# Patient Record
Sex: Male | Born: 1955 | Race: White | Hispanic: No | Smoking: Former smoker
Health system: Southern US, Community
[De-identification: ages and names within clinical notes are randomized; demographics above are authoritative.]

## PROBLEM LIST (undated history)

## (undated) DIAGNOSIS — I10 Essential (primary) hypertension: Secondary | ICD-10-CM

## (undated) DIAGNOSIS — F329 Major depressive disorder, single episode, unspecified: Secondary | ICD-10-CM

## (undated) DIAGNOSIS — K219 Gastro-esophageal reflux disease without esophagitis: Secondary | ICD-10-CM

## (undated) DIAGNOSIS — F32A Depression, unspecified: Secondary | ICD-10-CM

## (undated) DIAGNOSIS — F1010 Alcohol abuse, uncomplicated: Secondary | ICD-10-CM

---

## 1991-10-26 HISTORY — PX: HERNIA REPAIR: SHX51

## 2012-05-25 ENCOUNTER — Encounter: Payer: Self-pay | Admitting: Internal Medicine

## 2014-07-11 ENCOUNTER — Encounter (HOSPITAL_COMMUNITY): Payer: Self-pay | Admitting: Emergency Medicine

## 2014-07-11 ENCOUNTER — Inpatient Hospital Stay (HOSPITAL_COMMUNITY)
Admission: EM | Admit: 2014-07-11 | Discharge: 2014-07-18 | DRG: 640 | Disposition: A | Discharge status: Home / Self Care | Payer: 59 | Attending: Internal Medicine | Admitting: Internal Medicine

## 2014-07-11 DIAGNOSIS — R5381 Other malaise: Secondary | ICD-10-CM | POA: Diagnosis present

## 2014-07-11 DIAGNOSIS — I10 Essential (primary) hypertension: Secondary | ICD-10-CM | POA: Diagnosis present

## 2014-07-11 DIAGNOSIS — F1910 Other psychoactive substance abuse, uncomplicated: Secondary | ICD-10-CM | POA: Diagnosis present

## 2014-07-11 DIAGNOSIS — F329 Major depressive disorder, single episode, unspecified: Secondary | ICD-10-CM | POA: Diagnosis present

## 2014-07-11 DIAGNOSIS — G9340 Encephalopathy, unspecified: Secondary | ICD-10-CM | POA: Diagnosis present

## 2014-07-11 DIAGNOSIS — F411 Generalized anxiety disorder: Secondary | ICD-10-CM | POA: Diagnosis present

## 2014-07-11 DIAGNOSIS — E871 Hypo-osmolality and hyponatremia: Secondary | ICD-10-CM | POA: Diagnosis not present

## 2014-07-11 DIAGNOSIS — D649 Anemia, unspecified: Secondary | ICD-10-CM | POA: Diagnosis present

## 2014-07-11 DIAGNOSIS — Z6834 Body mass index (BMI) 34.0-34.9, adult: Secondary | ICD-10-CM

## 2014-07-11 DIAGNOSIS — F3289 Other specified depressive episodes: Secondary | ICD-10-CM | POA: Diagnosis present

## 2014-07-11 DIAGNOSIS — Z23 Encounter for immunization: Secondary | ICD-10-CM

## 2014-07-11 DIAGNOSIS — F32A Depression, unspecified: Secondary | ICD-10-CM | POA: Diagnosis present

## 2014-07-11 DIAGNOSIS — F1010 Alcohol abuse, uncomplicated: Secondary | ICD-10-CM | POA: Diagnosis present

## 2014-07-11 DIAGNOSIS — J9600 Acute respiratory failure, unspecified whether with hypoxia or hypercapnia: Secondary | ICD-10-CM | POA: Diagnosis present

## 2014-07-11 DIAGNOSIS — R4182 Altered mental status, unspecified: Secondary | ICD-10-CM

## 2014-07-11 DIAGNOSIS — Z720 Tobacco use: Secondary | ICD-10-CM | POA: Diagnosis present

## 2014-07-11 DIAGNOSIS — J189 Pneumonia, unspecified organism: Secondary | ICD-10-CM | POA: Diagnosis present

## 2014-07-11 DIAGNOSIS — E876 Hypokalemia: Secondary | ICD-10-CM | POA: Diagnosis present

## 2014-07-11 DIAGNOSIS — G4733 Obstructive sleep apnea (adult) (pediatric): Secondary | ICD-10-CM | POA: Diagnosis present

## 2014-07-11 DIAGNOSIS — R569 Unspecified convulsions: Secondary | ICD-10-CM | POA: Diagnosis present

## 2014-07-11 DIAGNOSIS — J9601 Acute respiratory failure with hypoxia: Secondary | ICD-10-CM | POA: Diagnosis present

## 2014-07-11 DIAGNOSIS — K219 Gastro-esophageal reflux disease without esophagitis: Secondary | ICD-10-CM | POA: Diagnosis present

## 2014-07-11 DIAGNOSIS — I4891 Unspecified atrial fibrillation: Secondary | ICD-10-CM | POA: Diagnosis present

## 2014-07-11 DIAGNOSIS — E662 Morbid (severe) obesity with alveolar hypoventilation: Secondary | ICD-10-CM | POA: Diagnosis present

## 2014-07-11 DIAGNOSIS — Z87891 Personal history of nicotine dependence: Secondary | ICD-10-CM

## 2014-07-11 HISTORY — DX: Essential (primary) hypertension: I10

## 2014-07-11 HISTORY — DX: Alcohol abuse, uncomplicated: F10.10

## 2014-07-11 HISTORY — DX: Major depressive disorder, single episode, unspecified: F32.9

## 2014-07-11 HISTORY — DX: Depression, unspecified: F32.A

## 2014-07-11 HISTORY — DX: Gastro-esophageal reflux disease without esophagitis: K21.9

## 2014-07-11 LAB — I-STAT TROPONIN, ED: Troponin i, poc: 0.01 ng/mL (ref 0.00–0.08)

## 2014-07-11 LAB — I-STAT CG4 LACTIC ACID, ED: Lactic Acid, Venous: 2.3 mmol/L — ABNORMAL HIGH (ref 0.5–2.2)

## 2014-07-11 MED ORDER — SODIUM CHLORIDE 0.9 % IV BOLUS (SEPSIS)
1000.0000 mL | Freq: Once | INTRAVENOUS | Status: AC
  Administered 2014-07-11: 1000 mL via INTRAVENOUS

## 2014-07-11 NOTE — ED Notes (Addendum)
Pt presents from ED via [facility] with c/o seizure activity and AMS. Pt's girlfriend found pt on floor around 2115 and noted seizure like activity and possibly hit head on a pool table while falling. EMS arrived and patient became physically combative but remained nonverbal - total of  Midazolam given by EMS PTA. EMS noted Left lower lip and tongue injury, per EMS pt's girlfriend reported pt has been drinking an increased amount of alcohol and possibly utilizing prescription drugs in the last few days. Half empty bottle of Nyquil was found next to patient, unsure if this was ingested or not.  Incontinence noted upon arrival, pt with snoring respirations and on non-rebreather, unresponsive to painful stimuli.

## 2014-07-11 NOTE — ED Provider Notes (Signed)
CSN: [id number]     Arrival date & time 07/11/14  2241 History   First MD Initiated Contact with Patient 07/11/14 2256     Chief Complaint  Patient presents with  . Altered Mental Status  . Seizures     (Consider location/radiation/quality/duration/timing/severity/associated sxs/prior Treatment) HPI Jeffrey Humphrey is a 58 y.o. male with unknown past medical history coming in with altered mental status after being found down. History cannot be obtained by the patient due to his altered mental status however EMS states that his girlfriend found him on the ground at home underneath a pool table. The girlfriend thought she saw seizure-like activity, she began chest compressions for approximately 30 seconds. Upon EMS arrival, there were no chest compressions given and the patient was breathing with a pulse. In route the patient became increasingly agitated and EMS gave him 2 mg of midazolam.  There were multiple prescription drugs laying next to him as well as a bottle of Nyquil.  Patient is currently responding to pain, but him in front of the history.   History reviewed. No pertinent past medical history. History reviewed. No pertinent past surgical history. No family history on file. History  Substance Use Topics  . Smoking status: Not on file  . Smokeless tobacco: Not on file  . Alcohol Use: Not on file    Review of Systems  Unable to perform ROS: Mental status change      Allergies  Review of patient's allergies indicates not on file.  Home Medications   Prior to Admission medications   Not on File   BP 114/56  Pulse 81  Temp(Src) 97.6 F (36.4 C) (Axillary)  Resp 14  SpO2 100% Physical Exam  ED Course  Procedures (including critical care time) Labs Review Labs Reviewed  CBC WITH DIFFERENTIAL - Abnormal; Notable for the following:    WBC 15.6 (*)    RBC 3.88 (*)    HCT 34.6 (*)    MCHC 38.2 (*)    Neutrophils Relative % 91 (*)    Neutro Abs 14.2 (*)     Lymphocytes Relative 5 (*)    All other components within normal limits  COMPREHENSIVE METABOLIC PANEL - Abnormal; Notable for the following:    Sodium 114 (*)    Potassium 3.2 (*)    Chloride 74 (*)    Glucose, Bld 121 (*)    Albumin 3.4 (*)    AST 114 (*)    ALT 130 (*)    All other components within normal limits  LIPASE, BLOOD - Abnormal; Notable for the following:    Lipase 276 (*)    All other components within normal limits  URINALYSIS, ROUTINE W REFLEX MICROSCOPIC - Abnormal; Notable for the following:    Hgb urine dipstick SMALL (*)    Ketones, ur 15 (*)    All other components within normal limits  CK - Abnormal; Notable for the following:    Total CK 415 (*)    All other components within normal limits  SALICYLATE LEVEL - Abnormal; Notable for the following:    Salicylate Lvl <2.0 (*)    All other components within normal limits  URINE RAPID DRUG SCREEN (HOSP PERFORMED) - Abnormal; Notable for the following:    Benzodiazepines POSITIVE (*)    All other components within normal limits  CBC - Abnormal; Notable for the following:    WBC 13.4 (*)    RBC 3.83 (*)    HCT 35.0 (*)  MCHC 37.1 (*)    All other components within normal limits  BASIC METABOLIC PANEL - Abnormal; Notable for the following:    Sodium 117 (*)    Potassium 3.4 (*)    Chloride 77 (*)    Glucose, Bld 117 (*)    Calcium 8.1 (*)    All other components within normal limits  URINALYSIS, ROUTINE W REFLEX MICROSCOPIC - Abnormal; Notable for the following:    Hgb urine dipstick MODERATE (*)    Ketones, ur 15 (*)    All other components within normal limits  GLUCOSE, CAPILLARY - Abnormal; Notable for the following:    Glucose-Capillary 134 (*)    All other components within normal limits  I-STAT CG4 LACTIC ACID, ED - Abnormal; Notable for the following:    Lactic Acid, Venous 2.30 (*)    All other components within normal limits  I-STAT VENOUS BLOOD GAS, ED - Abnormal; Notable for the following:     pH, Ven 7.416 (*)    pO2, Ven 50.0 (*)    Bicarbonate 29.1 (*)    Acid-Base Excess 4.0 (*)    All other components within normal limits  I-STAT CHEM 8, ED - Abnormal; Notable for the following:    Sodium 113 (*)    Chloride 77 (*)    Glucose, Bld 109 (*)    Calcium, Ion 1.02 (*)    All other components within normal limits  POCT I-STAT 3, ART BLOOD GAS (G3+) - Abnormal; Notable for the following:    pH, Arterial 7.243 (*)    pCO2 arterial 68.1 (*)    Bicarbonate 29.4 (*)    All other components within normal limits  POCT I-STAT 3, ART BLOOD GAS (G3+) - Abnormal; Notable for the following:    pO2, Arterial 178.0 (*)    Bicarbonate 25.0 (*)    All other components within normal limits  MRSA PCR SCREENING  URINE CULTURE  CULTURE, BLOOD (ROUTINE X 2)  CULTURE, BLOOD (ROUTINE X 2)  CULTURE, RESPIRATORY (NON-EXPECTORATED)  ACETAMINOPHEN LEVEL  ETHANOL  PROCALCITONIN  URINE MICROSCOPIC-ADD ON  MAGNESIUM  PHOSPHORUS  URINE MICROSCOPIC-ADD ON  BLOOD GAS, VENOUS  PRO B NATRIURETIC PEPTIDE  COMPREHENSIVE METABOLIC PANEL  BASIC METABOLIC PANEL  BASIC METABOLIC PANEL  BASIC METABOLIC PANEL  BASIC METABOLIC PANEL  BASIC METABOLIC PANEL  BASIC METABOLIC PANEL  BASIC METABOLIC PANEL  BASIC METABOLIC PANEL  LACTIC ACID, PLASMA  LACTIC ACID, PLASMA  LACTIC ACID, PLASMA  CBC  PROTIME-INR  BLOOD GAS, ARTERIAL  TRIGLYCERIDES  CBG MONITORING, ED  I-STAT TROPOININ, ED    Imaging Review Ct Head Wo Contrast  07/12/2014   CLINICAL DATA:  Altered mental status, seizure  EXAM: CT HEAD WITHOUT CONTRAST  CT CERVICAL SPINE WITHOUT CONTRAST  TECHNIQUE: Multidetector CT imaging of the head and cervical spine was performed following the standard protocol without intravenous contrast. Multiplanar CT image reconstructions of the cervical spine were also generated.  COMPARISON:  None.  FINDINGS: CT HEAD FINDINGS  There is no acute intracranial hemorrhage or infarct. No mass lesion or midline  shift. Gray-white matter differentiation is well maintained. Ventricles are normal in size without evidence of hydrocephalus. CSF containing spaces are within normal limits. No extra-axial fluid collection.  The calvarium is intact.  Orbital soft tissues are within normal limits.  The paranasal sinuses and mastoid air cells are well pneumatized and free of fluid.  Scalp soft tissues are unremarkable.  CT CERVICAL SPINE FINDINGS  Study is degraded by  motion artifact.  There is reversal of the normal cervical lordosis with apex at C4-5. No listhesis. Vertebral body heights are preserved. Normal C1-2 articulations are intact. There is fullness of the prevertebral soft tissues with the internal carotid arteries medialized into the retropharyngeal space. No retropharyngeal hematoma or other abnormality. No acute fracture or listhesis.  Advanced multilevel degenerative disc disease is evidenced by intervertebral disc space narrowing, endplate sclerosis, and osteophytosis is seen throughout the cervical spine, most evident at C4-5, C5-6, and C6-7.  Visualized soft tissues of the neck are within normal limits. Visualized lung apices are clear without evidence of apical pneumothorax.  IMPRESSION: CT BRAIN:  No acute intracranial process.  CT CERVICAL SPINE:  1. Motion degraded study with no definite acute traumatic injury within the cervical spine. 2. Reversal of the normal cervical lordosis with moderate multilevel degenerative disc disease, most severe at C4-5 through C6-7.   Electronically Signed   By: Rise Mu M.D.   On: 07/12/2014 01:28   Ct Cervical Spine Wo Contrast  07/12/2014   CLINICAL DATA:  Altered mental status, seizure  EXAM: CT HEAD WITHOUT CONTRAST  CT CERVICAL SPINE WITHOUT CONTRAST  TECHNIQUE: Multidetector CT imaging of the head and cervical spine was performed following the standard protocol without intravenous contrast. Multiplanar CT image reconstructions of the cervical spine were also  generated.  COMPARISON:  None.  FINDINGS: CT HEAD FINDINGS  There is no acute intracranial hemorrhage or infarct. No mass lesion or midline shift. Gray-white matter differentiation is well maintained. Ventricles are normal in size without evidence of hydrocephalus. CSF containing spaces are within normal limits. No extra-axial fluid collection.  The calvarium is intact.  Orbital soft tissues are within normal limits.  The paranasal sinuses and mastoid air cells are well pneumatized and free of fluid.  Scalp soft tissues are unremarkable.  CT CERVICAL SPINE FINDINGS  Study is degraded by motion artifact.  There is reversal of the normal cervical lordosis with apex at C4-5. No listhesis. Vertebral body heights are preserved. Normal C1-2 articulations are intact. There is fullness of the prevertebral soft tissues with the internal carotid arteries medialized into the retropharyngeal space. No retropharyngeal hematoma or other abnormality. No acute fracture or listhesis.  Advanced multilevel degenerative disc disease is evidenced by intervertebral disc space narrowing, endplate sclerosis, and osteophytosis is seen throughout the cervical spine, most evident at C4-5, C5-6, and C6-7.  Visualized soft tissues of the neck are within normal limits. Visualized lung apices are clear without evidence of apical pneumothorax.  IMPRESSION: CT BRAIN:  No acute intracranial process.  CT CERVICAL SPINE:  1. Motion degraded study with no definite acute traumatic injury within the cervical spine. 2. Reversal of the normal cervical lordosis with moderate multilevel degenerative disc disease, most severe at C4-5 through C6-7.   Electronically Signed   By: Rise Mu M.D.   On: 07/12/2014 01:28   Dg Chest Port 1 View  07/12/2014   CLINICAL DATA:  Endotracheal tube placement.  EXAM: PORTABLE CHEST - 1 VIEW  COMPARISON:  Chest radiograph performed earlier today at 1:30 a.m.  FINDINGS: The patient's endotracheal tube is seen  ending 2-3 cm above the carina. An enteric tube noted extending below the diaphragm. A right IJ line is noted ending about the mid SVC.  The lungs are hypoexpanded. Left basilar airspace opacity may reflect atelectasis or pneumonia. Vascular congestion and vascular crowding are seen. A small left pleural effusion is suspected. No pneumothorax is seen.  The cardiomediastinal  silhouette is borderline enlarged. No acute osseous abnormalities are identified.  IMPRESSION: 1. Endotracheal tube seen ending 2-3 cm above the carina. 2. Lungs hypoexpanded. Left basilar airspace opacity may reflect atelectasis or pneumonia. 3. Vascular congestion and borderline cardiomegaly. Small left pleural effusion suspected.   Electronically Signed   By: Roanna Raider M.D.   On: 07/12/2014 05:43   Dg Chest Portable 1 View  07/12/2014   CLINICAL DATA:  Central line placement.  EXAM: PORTABLE CHEST - 1 VIEW  COMPARISON:  None.  FINDINGS: Right IJ central line is in place, tip overlying the level of the superior vena cava. The patient is rotated towards the left. The heart is enlarged. No evidence for edema. No pneumothorax.  IMPRESSION: Right IJ central line tip overlying the superior vena cava.   Electronically Signed   By: Rosalie Gums M.D.   On: 07/12/2014 01:50     EKG Interpretation   Date/Time:  Thursday July 11 2014 22:50:15 EDT Ventricular Rate:  83 PR Interval:  194 QRS Duration: 106 QT Interval:  429 QTC Calculation: 504 R Axis:   35 Text Interpretation:  Sinus rhythm RSR' in V1 or V2, probably normal  variant Prolonged QT interval Confirmed by Erroll Luna [phone])  on 07/12/2014 6:54:32 AM      MDM   Final diagnoses:  None    Patient presents emergency department out of concern for altered mental status and seizure. He was immediately sedated in the emergency department with Ativan so we could obtain CT imaging. He required a total of 6 mg of Ativan and 5 mg of Haldol throughout his ED  stay. CT scan as above is negative. Laboratory studies revealed a sodium of 114. This is likely the cause of the patient's seizure at home, and they have been drug-induced, considering ecstasy. Patient was bolused 100 cc of 3% normal saline via central line. She denied any seizure activity in the emergency department, however he was persistently altered and not returned back to his baseline.  He was given 2 L of IV fluids.  CENTRAL LINE Performed by: Tomasita Crumble Consent: The procedure was performed in an emergent situation. Required items: required blood products, implants, devices, and special equipment available Patient identity confirmed: arm band and provided demographic data Time out: Immediately prior to procedure a "time out" was called to verify the correct patient, procedure, equipment, support staff and site/side marked as required. Indications: vascular access Anesthesia: local infiltration Local anesthetic: lidocaine 1% with epinephrine Anesthetic total: 3 ml Patient sedated: no Preparation: skin prepped with 2% chlorhexidine Skin prep agent dried: skin prep agent completely dried prior to procedure Sterile barriers: all five maximum sterile barriers used - cap, mask, sterile gown, sterile gloves, and large sterile sheet Hand hygiene: hand hygiene performed prior to central venous catheter insertion  Location details: Right IJ   Catheter type: triple lumen Catheter size: 8 Fr Pre-procedure: landmarks identified Ultrasound guidance: Dynamic approach  Successful placement: yes Post-procedure: line sutured and dressing applied Assessment: blood return through all parts, free fluid flow, placement verified by x-ray and no pneumothorax on x-ray Patient tolerance: Patient tolerated the procedure well with no immediate complications.  CRITICAL CARE Performed by: Tomasita Crumble   Total critical care time: 45 minutes  Critical care time was exclusive of separately billable  procedures and treating other patients.  Critical care was necessary to treat or prevent imminent or life-threatening deterioration.  Critical care was time spent personally by me on the following activities: development of  treatment plan with patient and/or surrogate as well as nursing, discussions with consultants, evaluation of patient's response to treatment, examination of patient, obtaining history from patient or surrogate, ordering and performing treatments and interventions, ordering and review of laboratory studies, ordering and review of radiographic studies, pulse oximetry and re-evaluation of patient's condition.     Tomasita Crumble, MD 07/12/14 [phone]

## 2014-07-11 NOTE — ED Notes (Signed)
Dr. Oni at bedside. 

## 2014-07-12 ENCOUNTER — Emergency Department (HOSPITAL_COMMUNITY): Payer: 59

## 2014-07-12 ENCOUNTER — Encounter (HOSPITAL_COMMUNITY): Payer: Self-pay | Admitting: Anesthesiology

## 2014-07-12 ENCOUNTER — Inpatient Hospital Stay (HOSPITAL_COMMUNITY): Payer: 59

## 2014-07-12 ENCOUNTER — Encounter (HOSPITAL_COMMUNITY): Payer: 59 | Admitting: Anesthesiology

## 2014-07-12 ENCOUNTER — Encounter (HOSPITAL_COMMUNITY): Payer: Self-pay

## 2014-07-12 DIAGNOSIS — F411 Generalized anxiety disorder: Secondary | ICD-10-CM | POA: Diagnosis present

## 2014-07-12 DIAGNOSIS — E662 Morbid (severe) obesity with alveolar hypoventilation: Secondary | ICD-10-CM | POA: Diagnosis present

## 2014-07-12 DIAGNOSIS — D649 Anemia, unspecified: Secondary | ICD-10-CM | POA: Diagnosis present

## 2014-07-12 DIAGNOSIS — I369 Nonrheumatic tricuspid valve disorder, unspecified: Secondary | ICD-10-CM

## 2014-07-12 DIAGNOSIS — G9340 Encephalopathy, unspecified: Secondary | ICD-10-CM | POA: Diagnosis present

## 2014-07-12 DIAGNOSIS — E871 Hypo-osmolality and hyponatremia: Secondary | ICD-10-CM | POA: Diagnosis present

## 2014-07-12 DIAGNOSIS — Z87891 Personal history of nicotine dependence: Secondary | ICD-10-CM | POA: Diagnosis not present

## 2014-07-12 DIAGNOSIS — F1010 Alcohol abuse, uncomplicated: Secondary | ICD-10-CM | POA: Diagnosis present

## 2014-07-12 DIAGNOSIS — I4891 Unspecified atrial fibrillation: Secondary | ICD-10-CM | POA: Diagnosis not present

## 2014-07-12 DIAGNOSIS — Z6834 Body mass index (BMI) 34.0-34.9, adult: Secondary | ICD-10-CM | POA: Diagnosis not present

## 2014-07-12 DIAGNOSIS — R569 Unspecified convulsions: Secondary | ICD-10-CM | POA: Diagnosis present

## 2014-07-12 DIAGNOSIS — R4182 Altered mental status, unspecified: Secondary | ICD-10-CM | POA: Diagnosis present

## 2014-07-12 DIAGNOSIS — F3289 Other specified depressive episodes: Secondary | ICD-10-CM | POA: Diagnosis present

## 2014-07-12 DIAGNOSIS — R5381 Other malaise: Secondary | ICD-10-CM | POA: Diagnosis present

## 2014-07-12 DIAGNOSIS — G4733 Obstructive sleep apnea (adult) (pediatric): Secondary | ICD-10-CM | POA: Diagnosis present

## 2014-07-12 DIAGNOSIS — J189 Pneumonia, unspecified organism: Secondary | ICD-10-CM | POA: Diagnosis present

## 2014-07-12 DIAGNOSIS — F329 Major depressive disorder, single episode, unspecified: Secondary | ICD-10-CM | POA: Diagnosis present

## 2014-07-12 DIAGNOSIS — K219 Gastro-esophageal reflux disease without esophagitis: Secondary | ICD-10-CM | POA: Diagnosis present

## 2014-07-12 DIAGNOSIS — I10 Essential (primary) hypertension: Secondary | ICD-10-CM | POA: Diagnosis present

## 2014-07-12 DIAGNOSIS — J9600 Acute respiratory failure, unspecified whether with hypoxia or hypercapnia: Secondary | ICD-10-CM | POA: Diagnosis present

## 2014-07-12 DIAGNOSIS — Z23 Encounter for immunization: Secondary | ICD-10-CM | POA: Diagnosis not present

## 2014-07-12 DIAGNOSIS — F1910 Other psychoactive substance abuse, uncomplicated: Secondary | ICD-10-CM | POA: Diagnosis present

## 2014-07-12 DIAGNOSIS — E876 Hypokalemia: Secondary | ICD-10-CM | POA: Diagnosis present

## 2014-07-12 LAB — BASIC METABOLIC PANEL
ANION GAP: 13 (ref 5–15)
Anion gap: 12 (ref 5–15)
Anion gap: 12 (ref 5–15)
Anion gap: 11 (ref 5–15)
Anion gap: 11 (ref 5–15)
Anion gap: 11 (ref 5–15)
BUN: 11 mg/dL (ref 6–23)
BUN: 8 mg/dL (ref 6–23)
BUN: 8 mg/dL (ref 6–23)
BUN: 10 mg/dL (ref 6–23)
BUN: 10 mg/dL (ref 6–23)
BUN: 11 mg/dL (ref 6–23)
CALCIUM: 8.4 mg/dL (ref 8.4–10.5)
CALCIUM: 8.5 mg/dL (ref 8.4–10.5)
CHLORIDE: 77 meq/L — AB (ref 96–112)
CHLORIDE: 89 meq/L — AB (ref 96–112)
CHLORIDE: 90 meq/L — AB (ref 96–112)
CO2: 27 meq/L (ref 19–32)
CO2: 24 mEq/L (ref 19–32)
CO2: 24 mEq/L (ref 19–32)
CO2: 24 meq/L (ref 19–32)
CO2: 25 mEq/L (ref 19–32)
CO2: 24 meq/L (ref 19–32)
Calcium: 8.1 mg/dL — ABNORMAL LOW (ref 8.4–10.5)
Calcium: 8.2 mg/dL — ABNORMAL LOW (ref 8.4–10.5)
Calcium: 8.3 mg/dL — ABNORMAL LOW (ref 8.4–10.5)
Calcium: 8.3 mg/dL — ABNORMAL LOW (ref 8.4–10.5)
Chloride: 87 mEq/L — ABNORMAL LOW (ref 96–112)
Chloride: 87 mEq/L — ABNORMAL LOW (ref 96–112)
Chloride: 87 mEq/L — ABNORMAL LOW (ref 96–112)
Creatinine, Ser: 0.66 mg/dL (ref 0.50–1.35)
Creatinine, Ser: 0.71 mg/dL (ref 0.50–1.35)
Creatinine, Ser: 0.73 mg/dL (ref 0.50–1.35)
Creatinine, Ser: 0.74 mg/dL (ref 0.50–1.35)
Creatinine, Ser: 0.75 mg/dL (ref 0.50–1.35)
Creatinine, Ser: 0.73 mg/dL (ref 0.50–1.35)
GFR calc Af Amer: >90 mL/min (ref >90)
GFR calc Af Amer: >90 mL/min (ref >90)
GFR calc non Af Amer: >90 mL/min (ref >90)
GFR calc non Af Amer: >90 mL/min (ref >90)
GFR calc non Af Amer: >90 mL/min (ref >90)
GLUCOSE: 135 mg/dL — AB (ref 70–99)
Glucose, Bld: 117 mg/dL — ABNORMAL HIGH (ref 70–99)
Glucose, Bld: 112 mg/dL — ABNORMAL HIGH (ref 70–99)
Glucose, Bld: 114 mg/dL — ABNORMAL HIGH (ref 70–99)
Glucose, Bld: 123 mg/dL — ABNORMAL HIGH (ref 70–99)
Glucose, Bld: 130 mg/dL — ABNORMAL HIGH (ref 70–99)
POTASSIUM: 3.4 meq/L — AB (ref 3.7–5.3)
POTASSIUM: 3.2 meq/L — AB (ref 3.7–5.3)
POTASSIUM: 3 meq/L — AB (ref 3.7–5.3)
Potassium: 3.3 mEq/L — ABNORMAL LOW (ref 3.7–5.3)
Potassium: 3.3 mEq/L — ABNORMAL LOW (ref 3.7–5.3)
Potassium: 3.1 mEq/L — ABNORMAL LOW (ref 3.7–5.3)
SODIUM: 123 meq/L — AB (ref 137–147)
SODIUM: 124 meq/L — AB (ref 137–147)
SODIUM: 125 meq/L — AB (ref 137–147)
Sodium: 117 mEq/L — CL (ref 137–147)
Sodium: 123 mEq/L — ABNORMAL LOW (ref 137–147)
Sodium: 123 mEq/L — ABNORMAL LOW (ref 137–147)

## 2014-07-12 LAB — GLUCOSE, CAPILLARY
GLUCOSE-CAPILLARY: 134 mg/dL — AB (ref 70–99)
GLUCOSE-CAPILLARY: 116 mg/dL — AB (ref 70–99)
GLUCOSE-CAPILLARY: 110 mg/dL — AB (ref 70–99)
GLUCOSE-CAPILLARY: 124 mg/dL — AB (ref 70–99)
Glucose-Capillary: 109 mg/dL — ABNORMAL HIGH (ref 70–99)

## 2014-07-12 LAB — I-STAT VENOUS BLOOD GAS, ED
Acid-Base Excess: 4 mmol/L — ABNORMAL HIGH (ref 0.0–2.0)
Bicarbonate: 29.1 mEq/L — ABNORMAL HIGH (ref 20.0–24.0)
O2 Saturation: 85 %
PCO2 VEN: 45.2 mmHg (ref 45.0–50.0)
PO2 VEN: 50 mmHg — AB (ref 30.0–45.0)
TCO2: 30 mmol/L (ref 0–100)
pH, Ven: 7.416 — ABNORMAL HIGH (ref 7.250–7.300)

## 2014-07-12 LAB — CBC WITH DIFFERENTIAL/PLATELET
Basophils Absolute: 0 10*3/uL (ref 0.0–0.1)
Basophils Relative: 0 % (ref 0–1)
EOS PCT: 0 % (ref 0–5)
Eosinophils Absolute: 0.1 10*3/uL (ref 0.0–0.7)
HEMATOCRIT: 34.6 % — AB (ref 39.0–52.0)
HEMOGLOBIN: 13.2 g/dL (ref 13.0–17.0)
LYMPHS ABS: 0.7 10*3/uL (ref 0.7–4.0)
Lymphocytes Relative: 5 % — ABNORMAL LOW (ref 12–46)
MCH: 34 pg (ref 26.0–34.0)
MCHC: 38.2 g/dL — ABNORMAL HIGH (ref 30.0–36.0)
MCV: 89.2 fL (ref 78.0–100.0)
MONO ABS: 0.6 10*3/uL (ref 0.1–1.0)
Monocytes Relative: 4 % (ref 3–12)
Neutro Abs: 14.2 10*3/uL — ABNORMAL HIGH (ref 1.7–7.7)
Neutrophils Relative %: 91 % — ABNORMAL HIGH (ref 43–77)
Platelets: 276 10*3/uL (ref 150–400)
RBC: 3.88 MIL/uL — AB (ref 4.22–5.81)
RDW: 12.4 % (ref 11.5–15.5)
WBC: 15.6 10*3/uL — ABNORMAL HIGH (ref 4.0–10.5)

## 2014-07-12 LAB — COMPREHENSIVE METABOLIC PANEL
ALT: 130 U/L — AB (ref 0–53)
ALT: 121 U/L — AB (ref 0–53)
ANION GAP: 13 (ref 5–15)
AST: 114 U/L — ABNORMAL HIGH (ref 0–37)
AST: 111 U/L — ABNORMAL HIGH (ref 0–37)
Albumin: 3.4 g/dL — ABNORMAL LOW (ref 3.5–5.2)
Albumin: 3.3 g/dL — ABNORMAL LOW (ref 3.5–5.2)
Alkaline Phosphatase: 90 U/L (ref 39–117)
Alkaline Phosphatase: 88 U/L (ref 39–117)
Anion gap: 14 (ref 5–15)
BILIRUBIN TOTAL: 0.7 mg/dL (ref 0.3–1.2)
BUN: 13 mg/dL (ref 6–23)
BUN: 10 mg/dL (ref 6–23)
CALCIUM: 8.4 mg/dL (ref 8.4–10.5)
CALCIUM: 8.5 mg/dL (ref 8.4–10.5)
CO2: 26 meq/L (ref 19–32)
CO2: 25 mEq/L (ref 19–32)
CREATININE: 0.74 mg/dL (ref 0.50–1.35)
Chloride: 74 mEq/L — ABNORMAL LOW (ref 96–112)
Chloride: 79 mEq/L — ABNORMAL LOW (ref 96–112)
Creatinine, Ser: 0.69 mg/dL (ref 0.50–1.35)
GFR calc Af Amer: >90 mL/min (ref >90)
GFR calc non Af Amer: >90 mL/min (ref >90)
Glucose, Bld: 121 mg/dL — ABNORMAL HIGH (ref 70–99)
Glucose, Bld: 124 mg/dL — ABNORMAL HIGH (ref 70–99)
POTASSIUM: 3.5 meq/L — AB (ref 3.7–5.3)
Potassium: 3.2 mEq/L — ABNORMAL LOW (ref 3.7–5.3)
SODIUM: 117 meq/L — AB (ref 137–147)
Sodium: 114 mEq/L — CL (ref 137–147)
TOTAL PROTEIN: 6.3 g/dL (ref 6.0–8.3)
Total Bilirubin: 0.7 mg/dL (ref 0.3–1.2)
Total Protein: 6.4 g/dL (ref 6.0–8.3)

## 2014-07-12 LAB — CBC
HCT: 35 % — ABNORMAL LOW (ref 39.0–52.0)
HCT: 34.3 % — ABNORMAL LOW (ref 39.0–52.0)
HEMOGLOBIN: 13 g/dL (ref 13.0–17.0)
Hemoglobin: 12.8 g/dL — ABNORMAL LOW (ref 13.0–17.0)
MCH: 33.9 pg (ref 26.0–34.0)
MCH: 34.5 pg — AB (ref 26.0–34.0)
MCHC: 37.1 g/dL — ABNORMAL HIGH (ref 30.0–36.0)
MCV: 91.4 fL (ref 78.0–100.0)
MCV: 92.5 fL (ref 78.0–100.0)
Platelets: 248 10*3/uL (ref 150–400)
Platelets: 249 10*3/uL (ref 150–400)
RBC: 3.83 MIL/uL — ABNORMAL LOW (ref 4.22–5.81)
RBC: 3.71 MIL/uL — ABNORMAL LOW (ref 4.22–5.81)
RDW: 12.5 % (ref 11.5–15.5)
RDW: 12.5 % (ref 11.5–15.5)
WBC: 13.4 10*3/uL — ABNORMAL HIGH (ref 4.0–10.5)
WBC: 12.5 10*3/uL — ABNORMAL HIGH (ref 4.0–10.5)

## 2014-07-12 LAB — POCT I-STAT 3, ART BLOOD GAS (G3+)
Bicarbonate: 29.4 mEq/L — ABNORMAL HIGH (ref 20.0–24.0)
Bicarbonate: 25 mEq/L — ABNORMAL HIGH (ref 20.0–24.0)
O2 SAT: 95 %
O2 Saturation: 100 %
PH ART: 7.392 (ref 7.350–7.450)
PO2 ART: 91 mmHg (ref 80.0–100.0)
Patient temperature: 98.5
Patient temperature: 98.5
TCO2: 31 mmol/L (ref 0–100)
TCO2: 26 mmol/L (ref 0–100)
pCO2 arterial: 68.1 mmHg (ref 35.0–45.0)
pCO2 arterial: 41.1 mmHg (ref 35.0–45.0)
pH, Arterial: 7.243 — ABNORMAL LOW (ref 7.350–7.450)
pO2, Arterial: 178 mmHg — ABNORMAL HIGH (ref 80.0–100.0)

## 2014-07-12 LAB — ETHANOL

## 2014-07-12 LAB — PROCALCITONIN: Procalcitonin: 0.12 ng/mL

## 2014-07-12 LAB — SALICYLATE LEVEL

## 2014-07-12 LAB — RAPID URINE DRUG SCREEN, HOSP PERFORMED
Amphetamines: NOT DETECTED
Barbiturates: NOT DETECTED
Benzodiazepines: POSITIVE — AB
COCAINE: NOT DETECTED
Opiates: NOT DETECTED
Tetrahydrocannabinol: NOT DETECTED

## 2014-07-12 LAB — LIPASE, BLOOD: LIPASE: 276 U/L — AB (ref 11–59)

## 2014-07-12 LAB — MAGNESIUM
MAGNESIUM: 1.9 mg/dL (ref 1.5–2.5)
Magnesium: 2.2 mg/dL (ref 1.5–2.5)

## 2014-07-12 LAB — URINALYSIS, ROUTINE W REFLEX MICROSCOPIC
BILIRUBIN URINE: NEGATIVE
Bilirubin Urine: NEGATIVE
GLUCOSE, UA: NEGATIVE mg/dL
Glucose, UA: NEGATIVE mg/dL
KETONES UR: 15 mg/dL — AB
KETONES UR: 15 mg/dL — AB
Leukocytes, UA: NEGATIVE
Leukocytes, UA: NEGATIVE
NITRITE: NEGATIVE
Nitrite: NEGATIVE
PH: 6.5 (ref 5.0–8.0)
PROTEIN: NEGATIVE mg/dL
Protein, ur: NEGATIVE mg/dL
SPECIFIC GRAVITY, URINE: 1.01 (ref 1.005–1.030)
Specific Gravity, Urine: 1.01 (ref 1.005–1.030)
Urobilinogen, UA: 1 mg/dL (ref 0.0–1.0)
Urobilinogen, UA: 1 mg/dL (ref 0.0–1.0)
pH: 6.5 (ref 5.0–8.0)

## 2014-07-12 LAB — URINE MICROSCOPIC-ADD ON

## 2014-07-12 LAB — TRIGLYCERIDES: Triglycerides: 77 mg/dL (ref <150)

## 2014-07-12 LAB — I-STAT CHEM 8, ED
BUN: 15 mg/dL (ref 6–23)
CHLORIDE: 77 meq/L — AB (ref 96–112)
Calcium, Ion: 1.02 mmol/L — ABNORMAL LOW (ref 1.12–1.23)
Creatinine, Ser: 0.7 mg/dL (ref 0.50–1.35)
GLUCOSE: 109 mg/dL — AB (ref 70–99)
HEMATOCRIT: 43 % (ref 39.0–52.0)
Hemoglobin: 14.6 g/dL (ref 13.0–17.0)
POTASSIUM: 4.1 meq/L (ref 3.7–5.3)
Sodium: 113 mEq/L — CL (ref 137–147)
TCO2: 28 mmol/L (ref 0–100)

## 2014-07-12 LAB — LACTIC ACID, PLASMA
LACTIC ACID, VENOUS: 1 mmol/L (ref 0.5–2.2)
Lactic Acid, Venous: 2 mmol/L (ref 0.5–2.2)
Lactic Acid, Venous: 1.1 mmol/L (ref 0.5–2.2)

## 2014-07-12 LAB — PHOSPHORUS
PHOSPHORUS: 2.7 mg/dL (ref 2.3–4.6)
Phosphorus: 1.4 mg/dL — ABNORMAL LOW (ref 2.3–4.6)

## 2014-07-12 LAB — ACETAMINOPHEN LEVEL: Acetaminophen (Tylenol), Serum: <15 ug/mL (ref 10–30)

## 2014-07-12 LAB — PROTIME-INR
INR: 1.07 (ref 0.00–1.49)
Prothrombin Time: 13.9 seconds (ref 11.6–15.2)

## 2014-07-12 LAB — PRO B NATRIURETIC PEPTIDE: Pro B Natriuretic peptide (BNP): 367.2 pg/mL — ABNORMAL HIGH (ref 0–125)

## 2014-07-12 LAB — STREP PNEUMONIAE URINARY ANTIGEN: STREP PNEUMO URINARY ANTIGEN: NEGATIVE

## 2014-07-12 LAB — CK: Total CK: 415 U/L — ABNORMAL HIGH (ref 7–232)

## 2014-07-12 LAB — MRSA PCR SCREENING: MRSA by PCR: NEGATIVE

## 2014-07-12 MED ORDER — VITAL HIGH PROTEIN PO LIQD
1000.0000 mL | ORAL | Status: DC
  Filled 2014-07-12 (×2): qty 1000

## 2014-07-12 MED ORDER — FENTANYL CITRATE 0.05 MG/ML IJ SOLN
100.0000 ug | Freq: Once | INTRAMUSCULAR | Status: AC
  Administered 2014-07-12: 100 ug via INTRAVENOUS

## 2014-07-12 MED ORDER — CETYLPYRIDINIUM CHLORIDE 0.05 % MT LIQD
7.0000 mL | Freq: Four times a day (QID) | OROMUCOSAL | Status: DC
  Administered 2014-07-12 – 2014-07-13 (×5): 7 mL via OROMUCOSAL

## 2014-07-12 MED ORDER — LORAZEPAM 2 MG/ML IJ SOLN
INTRAMUSCULAR | Status: AC
  Filled 2014-07-12: qty 1

## 2014-07-12 MED ORDER — LORAZEPAM 2 MG/ML IJ SOLN
2.0000 mg | Freq: Once | INTRAMUSCULAR | Status: AC
  Administered 2014-07-12: 2 mg via INTRAMUSCULAR

## 2014-07-12 MED ORDER — LORAZEPAM 2 MG/ML IJ SOLN
1.0000 mg | Freq: Once | INTRAMUSCULAR | Status: AC
  Administered 2014-07-12: 1 mg via INTRAVENOUS

## 2014-07-12 MED ORDER — PROPOFOL 10 MG/ML IV EMUL
INTRAVENOUS | Status: AC
  Filled 2014-07-12: qty 100

## 2014-07-12 MED ORDER — DEXTROSE 5 % IV SOLN
1.0000 g | INTRAVENOUS | Status: DC
  Administered 2014-07-12 – 2014-07-18 (×7): 1 g via INTRAVENOUS
  Filled 2014-07-12 (×8): qty 10

## 2014-07-12 MED ORDER — FENTANYL CITRATE 0.05 MG/ML IJ SOLN
100.0000 ug | INTRAMUSCULAR | Status: DC | PRN
  Administered 2014-07-13: 100 ug via INTRAVENOUS
  Filled 2014-07-12: qty 2

## 2014-07-12 MED ORDER — SODIUM CHLORIDE 0.9 % IV SOLN
10.0000 mmol | Freq: Once | INTRAVENOUS | Status: DC
Start: 2014-07-12 — End: 2014-07-12
  Filled 2014-07-12: qty 3.33

## 2014-07-12 MED ORDER — SODIUM CHLORIDE 3 % IV SOLN
INTRAVENOUS | Status: DC
  Administered 2014-07-12: 20 mL/h via INTRAVENOUS
  Administered 2014-07-12: 50 mL/h via INTRAVENOUS
  Filled 2014-07-12 (×3): qty 500

## 2014-07-12 MED ORDER — ASPIRIN 81 MG PO CHEW: 324.0000 mg | CHEWABLE_TABLET | ORAL | Status: AC

## 2014-07-12 MED ORDER — POTASSIUM CHLORIDE 20 MEQ/15ML (10%) PO LIQD
20.0000 meq | ORAL | Status: AC
  Administered 2014-07-12 (×2): 20 meq
  Filled 2014-07-12 (×2): qty 15

## 2014-07-12 MED ORDER — HALOPERIDOL LACTATE 5 MG/ML IJ SOLN
5.0000 mg | Freq: Once | INTRAMUSCULAR | Status: AC
  Administered 2014-07-12: 5 mg via INTRAMUSCULAR

## 2014-07-12 MED ORDER — HALOPERIDOL LACTATE 5 MG/ML IJ SOLN
INTRAMUSCULAR | Status: AC
  Filled 2014-07-12: qty 1

## 2014-07-12 MED ORDER — MAGNESIUM SULFATE 40 MG/ML IJ SOLN
2.0000 g | Freq: Once | INTRAMUSCULAR | Status: AC
  Administered 2014-07-12: 2 g via INTRAVENOUS
  Filled 2014-07-12: qty 50

## 2014-07-12 MED ORDER — DEXTROSE 5 % IV SOLN
500.0000 mg | INTRAVENOUS | Status: DC
  Administered 2014-07-12 – 2014-07-15 (×4): 500 mg via INTRAVENOUS
  Filled 2014-07-12 (×4): qty 500

## 2014-07-12 MED ORDER — PROPOFOL 10 MG/ML IV EMUL
0.0000 ug/kg/min | INTRAVENOUS | Status: DC
  Administered 2014-07-12 (×2): 25 ug/kg/min via INTRAVENOUS
  Administered 2014-07-13 (×2): 45 ug/kg/min via INTRAVENOUS
  Filled 2014-07-12 (×5): qty 100

## 2014-07-12 MED ORDER — VITAL HIGH PROTEIN PO LIQD: 1000.0000 mL | ORAL | Status: DC

## 2014-07-12 MED ORDER — FENTANYL CITRATE 0.05 MG/ML IJ SOLN
INTRAMUSCULAR | Status: AC
  Administered 2014-07-12: 100 ug via INTRAVENOUS
  Filled 2014-07-12: qty 4

## 2014-07-12 MED ORDER — INFLUENZA VAC SPLIT QUAD 0.5 ML IM SUSY
0.5000 mL | PREFILLED_SYRINGE | INTRAMUSCULAR | Status: AC
  Administered 2014-07-13: 0.5 mL via INTRAMUSCULAR
  Filled 2014-07-12: qty 0.5

## 2014-07-12 MED ORDER — ETOMIDATE 2 MG/ML IV SOLN: 20.0000 mg | Freq: Once | INTRAVENOUS | Status: DC

## 2014-07-12 MED ORDER — MIDAZOLAM HCL 2 MG/2ML IJ SOLN
INTRAMUSCULAR | Status: AC
  Administered 2014-07-12: 2 mg via INTRAVENOUS
  Filled 2014-07-12: qty 4

## 2014-07-12 MED ORDER — MIDAZOLAM HCL 2 MG/2ML IJ SOLN
2.0000 mg | Freq: Once | INTRAMUSCULAR | Status: AC
  Administered 2014-07-12: 2 mg via INTRAVENOUS

## 2014-07-12 MED ORDER — CHLORHEXIDINE GLUCONATE 0.12 % MT SOLN
15.0000 mL | Freq: Two times a day (BID) | OROMUCOSAL | Status: DC
  Administered 2014-07-12 – 2014-07-13 (×3): 15 mL via OROMUCOSAL
  Filled 2014-07-12 (×3): qty 15

## 2014-07-12 MED ORDER — PNEUMOCOCCAL VAC POLYVALENT 25 MCG/0.5ML IJ INJ
0.5000 mL | INJECTION | INTRAMUSCULAR | Status: AC
  Administered 2014-07-13: 0.5 mL via INTRAMUSCULAR
  Filled 2014-07-12: qty 0.5

## 2014-07-12 MED ORDER — HEPARIN SODIUM (PORCINE) 5000 UNIT/ML IJ SOLN
5000.0000 [IU] | Freq: Three times a day (TID) | INTRAMUSCULAR | Status: DC
  Filled 2014-07-12 (×3): qty 1

## 2014-07-12 MED ORDER — PANTOPRAZOLE SODIUM 40 MG IV SOLR
40.0000 mg | Freq: Every day | INTRAVENOUS | Status: DC
  Administered 2014-07-12 – 2014-07-13 (×2): 40 mg via INTRAVENOUS
  Filled 2014-07-12 (×4): qty 40

## 2014-07-12 MED ORDER — HEPARIN SODIUM (PORCINE) 5000 UNIT/ML IJ SOLN
5000.0000 [IU] | Freq: Three times a day (TID) | INTRAMUSCULAR | Status: DC
  Administered 2014-07-12 – 2014-07-18 (×18): 5000 [IU] via SUBCUTANEOUS
  Filled 2014-07-12 (×21): qty 1

## 2014-07-12 MED ORDER — FOLIC ACID 5 MG/ML IJ SOLN
1.0000 mg | Freq: Every day | INTRAMUSCULAR | Status: DC
  Administered 2014-07-12 – 2014-07-14 (×3): 1 mg via INTRAVENOUS
  Filled 2014-07-12 (×4): qty 0.2

## 2014-07-12 MED ORDER — THIAMINE HCL 100 MG/ML IJ SOLN
100.0000 mg | Freq: Every day | INTRAMUSCULAR | Status: DC
  Administered 2014-07-12 – 2014-07-13 (×2): 100 mg via INTRAVENOUS
  Filled 2014-07-12 (×3): qty 1

## 2014-07-12 MED ORDER — SODIUM CHLORIDE 3 % IV SOLN
100.0000 mL/h | INTRAVENOUS | Status: AC
  Administered 2014-07-12: 100 mL/h via INTRAVENOUS
  Filled 2014-07-12: qty 500

## 2014-07-12 MED ORDER — LORAZEPAM 2 MG/ML IJ SOLN
1.0000 mg | Freq: Three times a day (TID) | INTRAMUSCULAR | Status: DC
  Administered 2014-07-12 – 2014-07-15 (×9): 1 mg via INTRAVENOUS
  Filled 2014-07-12 (×8): qty 1

## 2014-07-12 MED ORDER — PRO-STAT SUGAR FREE PO LIQD
30.0000 mL | Freq: Two times a day (BID) | ORAL | Status: DC
  Filled 2014-07-12 (×2): qty 30

## 2014-07-12 MED ORDER — PRO-STAT SUGAR FREE PO LIQD
30.0000 mL | Freq: Four times a day (QID) | ORAL | Status: DC
  Administered 2014-07-12 – 2014-07-13 (×3): 30 mL
  Filled 2014-07-12 (×6): qty 30

## 2014-07-12 MED ORDER — VITAL HIGH PROTEIN PO LIQD
1000.0000 mL | ORAL | Status: DC
  Administered 2014-07-12 – 2014-07-13 (×2): 1000 mL
  Administered 2014-07-13: 11:00:00
  Filled 2014-07-12 (×2): qty 1000

## 2014-07-12 MED ORDER — LORAZEPAM 2 MG/ML IJ SOLN
2.0000 mg | Freq: Once | INTRAMUSCULAR | Status: AC
  Administered 2014-07-12: 2 mg via INTRAVENOUS

## 2014-07-12 MED ORDER — SODIUM CHLORIDE 0.9 % IV SOLN: 250.0000 mL | INTRAVENOUS | Status: DC | PRN

## 2014-07-12 MED ORDER — ASPIRIN 300 MG RE SUPP
300.0000 mg | RECTAL | Status: AC
  Administered 2014-07-12: 300 mg via RECTAL
  Filled 2014-07-12: qty 1

## 2014-07-12 MED ORDER — ALBUTEROL SULFATE (2.5 MG/3ML) 0.083% IN NEBU: 2.5000 mg | INHALATION_SOLUTION | RESPIRATORY_TRACT | Status: DC | PRN

## 2014-07-12 MED ORDER — SODIUM CHLORIDE 0.9 % IV SOLN
250.0000 mL | INTRAVENOUS | Status: DC | PRN
  Administered 2014-07-12: 250 mL via INTRAVENOUS

## 2014-07-12 NOTE — Brief Op Note (Signed)
Intubation Note:  Asked to assist with intubation in 58 year old male with ETOH abuse admitted with hyponatremia, seizures, and depressed mental status. Needs intubation for airway protection.  Patient given 14 mg IV etomidate and 1200 mg succinyl choline. DL x 1 with glide scope, cords well visualized. 8.0 glottic suction tube inserted without difficulty. (+) CO2 color change BBSE. Tube secured at 22 cm at the lip.  VS: T-36.9 BP 155/97 HR 111 O2 Sat 99% following intubation.  Kipp Brood

## 2014-07-12 NOTE — Progress Notes (Addendum)
[facility] ADULT ICU REPLACEMENT PROTOCOL FOR AM LAB REPLACEMENT ONLY  The patient does apply for the [facility] Adult ICU Electrolyte Replacment Protocol based on the criteria listed below:     1. Is GFR >/= 40 ml/min? Yes.    Patient's GFR today is >90. 2. Is urine output >/= 0.5 ml/kg/hr for the last 6 hours? Yes.   Patient's UOP is 0.70 ml/kg/hr 3. Is BUN < 60 mg/dL? Yes.    Patient's BUN today is 11. 4. Abnormal electrolyte(s): Potassium 3.4, Magnesium of 1.8, and Phosphorus of 2.2.  5. Ordered repletion with: per protocol.  6. If a panic level lab has been reported, has the CCM MD in charge been notified? No.  Physician: Dr. Carollee Massed, Sadiya Durand P 07/12/2014 5:44 AM

## 2014-07-12 NOTE — H&P (Signed)
PULMONARY / CRITICAL CARE MEDICINE   Name: Jeffrey Humphrey MRN: [id number] DOB: Oct 03, 1956    ADMISSION DATE:  07/11/2014 CONSULTATION DATE:  07/12/2014  REFERRING MD :  EDP  CHIEF COMPLAINT: seizure  INITIAL PRESENTATION: 58 year old male from home with seizure like activity. Required large amounts of benzo via EMS. In ED was obtunded with questionable airway protection. Reportedly drinks 10 beers per day. Hyponatremic 113 in ED. CVL placed and 3% started. PCCM asked to see for admission.   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   SIGNIFICANT EVENTS: 9/18 admitted for seizures.   GERE HISTORY OF PRESENT ILLNESS:  58 year old male with history of HTN, anxiety/depression, peripheral edema, and etoh abuse was found down seizing at home by his girlfriend. Over the past few months-year he has been having nightly episodes where he would wake up anxious hand drink a beer to calm his nerves, he also often would wake up for vomit during the night. 4/15 he was sent home from work because "his pupils were dilated". 9/17 he was let go from his job. Girlfriend reports he hasn't had beer for the past 2 days. 9/17 PM he was found down, seizing by girlfriend who called EMS. It is thought that he struck his head on pool table during fall, but unknown. He was given a total of  of midazolam by EMS. In ED it was noted that his Na is 113. He was started on 3% NS. PCCM asked to admit.   PAST MEDICAL HISTORY :  Past Medical History  Diagnosis Date  . HTN (hypertension)   . Depression   . GERD (gastroesophageal reflux disease)    History reviewed. No pertinent past surgical history. Prior to Admission medications   Medication Sig Start Date End Date Taking? Authorizing Provider  ALPRAZolam Prudy Feeler) 0.5 MG tablet Take 0.5 mg by mouth 2 (two) times daily as needed for anxiety.    Yes Historical Provider, MD  Aspirin-Acetaminophen-Caffeine (GOODY  HEADACHE PO) Take by mouth.   Yes Historical Provider, MD  diphenhydrAMINE (BENADRYL) 25 MG tablet Take 25 mg by mouth every 6 (six) hours as needed for allergies.   Yes Historical Provider, MD  furosemide (LASIX) 20 MG tablet Take 20 mg by mouth daily.   Yes Historical Provider, MD  Ginkgo Biloba 60 MG TABS Take 60 mg by mouth daily.   Yes Historical Provider, MD  ibuprofen (ADVIL,MOTRIN) 200 MG tablet Take 200 mg by mouth every 6 (six) hours as needed for moderate pain.   Yes Historical Provider, MD  losartan (COZAAR) 100 MG tablet Take 100 mg by mouth daily.   Yes Historical Provider, MD  Magnesium 250 MG TABS Take 250 mg by mouth daily.   Yes Historical Provider, MD  milk thistle 175 MG tablet Take 175 mg by mouth daily.   Yes Historical Provider, MD  naproxen sodium (ANAPROX) 220 MG tablet Take 220 mg by mouth 2 (two) times daily with a meal.   Yes Historical Provider, MD  nebivolol (BYSTOLIC) 10 MG tablet Take 10 mg by mouth daily.   Yes Historical Provider, MD  omeprazole (PRILOSEC OTC) 20 MG tablet Take 20 mg by mouth daily.   Yes Historical Provider, MD  PARoxetine (PAXIL) 20 MG tablet Take 20 mg by mouth daily.   Yes Historical Provider, MD  Pseudoeph-Doxylamine-DM-APAP (NYQUIL PO) Take by mouth at bedtime as needed (cold/sleep). Unknown dose amount   Yes  Historical Provider, MD  ranitidine (ZANTAC) 150 MG tablet Take 150 mg by mouth 2 (two) times daily.   Yes Historical Provider, MD  Zinc 50 MG TABS Take 50 mg by mouth daily.   Yes Historical Provider, MD   No Known Allergies  FAMILY HISTORY:  No family history on file. SOCIAL HISTORY:  reports that he drinks about 42 ounces of alcohol per week. His tobacco and drug histories are not on file.  REVIEW OF SYSTEMS:  Unable, intubated  SUBJECTIVE:   VITAL SIGNS: Temp:  [97.6 F (36.4 C)-98.5 F (36.9 C)] 98.5 F (36.9 C) (09/18 0500) Pulse Rate:  [81-104] 88 (09/18 0300) Resp:  [12-20] 18 (09/18 0300) BP: (114-167)/(56-97)  155/97 mmHg (09/18 0300) SpO2:  [95 %-100 %] 99 % (09/18 0300) FiO2 (%):  [60 %] 60 % (09/18 0533) Weight:  [110.6 kg (243 lb 13.3 oz)] 110.6 kg (243 lb 13.3 oz) (09/18 0330) HEMODYNAMICS:   VENTILATOR SETTINGS: Vent Mode:  [-] PRVC FiO2 (%):  [60 %] 60 % Set Rate:  [20 bmp] 20 bmp Vt Set:  [811 mL] 620 mL PEEP:  [5 cmH20] 5 cmH20 Plateau Pressure:  [21 cmH20] 21 cmH20 INTAKE / OUTPUT:  Intake/Output Summary (Last 24 hours) at 07/12/14 0540 Last data filed at 07/12/14 0400  Gross per 24 hour  Intake      0 ml  Output    470 ml  Net   -470 ml    PHYSICAL EXAMINATION: General:  Obese male in bed with sonorus respirations Neuro:  Minimally responsive. GCS 8-9 E2-3V2M4 HEENT:  [place], C-collar in place. No JVD noted Cardiovascular:  Borderline tachy, regular, no MRG Lungs:  Difficult to assess due to upper airway snoring. Respirations even, unlabored. Abdomen:  Obese, non-tender, non-distended.  Musculoskeletal:  No acute deformity Skin:  Intact  LABS:  CBC  Recent Labs Lab 07/11/14 2326 07/12/14 0201 07/12/14 0211  WBC 15.6* 13.4*  --   HGB 13.2 13.0 14.6  HCT 34.6* 35.0* 43.0  PLT 276 248  --    Coag's No results found for this basename: APTT, INR,  in the last 168 hours BMET  Recent Labs Lab 07/11/14 2326 07/12/14 0211 07/12/14 0345  NA 114* 113* 117*  K 3.2* 4.1 3.4*  CL 74* 77* 77*  CO2 26  --  27  BUN CREATININE 0.74 0.70 0.66  GLUCOSE 121* 109* 117*   Electrolytes  Recent Labs Lab 07/11/14 2326 07/12/14 0345  CALCIUM 8.4 8.1*  MG  --  1.9  PHOS  --  2.7   Sepsis Markers  Recent Labs Lab 07/11/14 2334 07/12/14 0205  LATICACIDVEN 2.30*  --   PROCALCITON  --  0.12   ABG  Recent Labs Lab 07/12/14 0449  PHART 7.243*  PCO2ART 68.1*  PO2ART 91.0   Liver Enzymes  Recent Labs Lab 07/11/14 2326  AST 114*  ALT 130*  ALKPHOS 90  BILITOT 0.7  ALBUMIN 3.4*   Cardiac Enzymes No results found for this basename:  TROPONINI, PROBNP,  in the last 168 hours Glucose  Recent Labs Lab 07/12/14 0409  GLUCAP 134*    Imaging No results found.   ASSESSMENT / PLAN:  PULMONARY A:  Acute hypercarbic respiratory failure in setting of encephalopathy, post ictal CAP OSA, likley obesity hypoventilation syndrome  P:   Intubate Full vent support SpO2 greater than 92% Follow CXR Daily SBT  CARDIOVASCULAR RIJ CVL 9/18 >>> A:  H/o hypertension  P:  Continuous cardiac monitoring Hold home anti-hypertensives Trend lactic acid  RENAL A:   Hyponatremia - ? Beer potomania  Hypocalcemia  P:   Careful sodium repletion Hypertonic 3% saline at 50cc/hr (0.5 cc/kg/hr) Bmet q2hours, do not correct faster than 0.5 mEq/hr  GASTROINTESTINAL A:   H/o GERD  P:   IV PPI NPO  HEMATOLOGIC A:   Oral bleeding, tongue injury s/p fall.  Leukocytosis   P:  Monitor CBC VTE ppx, SCD Hold chemoprophylaxis for now  INFECTIOUS A:   CAP- ? Aspiration  P:   BCx2 9/18 >>> Sputum 9/18 >>> Abx: unasyn, start date 9/18, day 1  ENDOCRINE A:   No acute issues   P:   Follow glucose on chemistry  NEUROLOGIC A:   Seizure Acute encehalopathy, multifactorial - hyponatremia, OD, DTs?  P:   RASS goal: 0 Sedation with propofol gtt, prn fentanyl Thiamine Folic acid Daily WUA Spot EEG  Joneen Roach, ACNP [facility] Pager [phone] or [phone]   Attending:  I have seen and examined the patient with nurse practitioner/resident and agree with the note above.   TODAY'S SUMMARY: 58 y/o male with polysubstance abuse here with hyponatremia induced seizures.  Worsening hypercapnic respiratory failure in setting of OSA and likely obesity hypoventilation syndrome.  Plan hypertonic saline, eeg, full vent support, treat CAP   I have personally obtained a history, examined the patient, evaluated laboratory and imaging results, formulated the assessment and plan and  placed orders. CRITICAL CARE: The patient is critically ill with multiple organ systems failure and requires high complexity decision making for assessment and support, frequent evaluation and titration of therapies, application of advanced monitoring technologies and extensive interpretation of multiple databases. Critical Care Time devoted to patient care services described in this note is 45  minutes.   Heber Hallam, MD [facility] Pager: [phone] Cell: [phone] If no response, call [phone]

## 2014-07-12 NOTE — Procedures (Signed)
ELECTROENCEPHALOGRAM REPORT  Patient: Jeffrey Humphrey       Room #: 1O10 EEG No. ID: [id number] Age: 58 y.o.        Sex: male Referring Physician: Kendrick Fries, D Report Date:  07/12/2014        Interpreting Physician: Aline Brochure  History: Jeffrey Humphrey is an 58 y.o. male with HTN, depression and etoh abuse admitted following a generalized seizure; had hyponatremia as well (sodium 113).  Indications for study:  Encephalopathy; rule out seizure activity.  Technique: This is an 18 channel routine scalp EEG performed at the bedside with bipolar and monopolar montages arranged in accordance to the international 10/20 system of electrode placement.   Description: On ventilator in ICU. Background activity consisted of diffuse low amplitude mixed irregular delta and theta activity, with superimposed diffuse beta activity. Photic stimulation and hyperventilation were not performed. No epileptiform discharges were recorded.  Interpretation: Abnormal EEG with moderate generalized nonspecific slowing. No seizure activity recorded.   Venetia Maxon M.D. [facility] [phone]

## 2014-07-12 NOTE — Progress Notes (Signed)
ANTIBIOTIC CONSULT NOTE - INITIAL  Pharmacy Consult for Ceftriaxone/Azithromycin  Indication: rule out pneumonia  No Known Allergies  Patient Measurements: Weight: 243 lb 13.3 oz (110.6 kg)  Vital Signs: Temp: 98.5 F (36.9 C) (09/18 0500) Temp src: Oral (09/18 0500) BP: 155/97 mmHg (09/18 0300) Pulse Rate: 88 (09/18 0300)  Labs:  Recent Labs  07/11/14 2326 07/12/14 0201 07/12/14 0211 07/12/14 0345  WBC 15.6* 13.4*  --   --   HGB 13.2 13.0 14.6  --   PLT 276 248  --   --   CREATININE 0.74  --  0.70 0.66    Microbiology: Recent Results (from the past 720 hour(s))  MRSA PCR SCREENING     Status: None   Collection Time    07/12/14  2:56 AM      Result Value Ref Range Status   MRSA by PCR NEGATIVE  NEGATIVE Final   Comment:            The GeneXpert MRSA Assay (FDA     approved for NASAL specimens     only), is one component of a     comprehensive MRSA colonization     surveillance program. It is not     intended to diagnose MRSA     infection nor to guide or     monitor treatment for     MRSA infections.    Medical History: Past Medical History  Diagnosis Date  . HTN (hypertension)   . Depression   . GERD (gastroesophageal reflux disease)     Assessment: 58 y/o M here with seizure like activity, hyponatremia, to start ceftriaxone and azithromycin for r/o PNA. WBC 13.4, renal function ok, other labs as above.   Plan:  -Ceftriaxone 1g IV q24h -Azithromycin 500 mg IV q24h -Trend WBC, temp, renal function  -Drug levels as indicated   Abran Duke 07/12/2014,6:15 AM

## 2014-07-12 NOTE — H&P (Signed)
PULMONARY / CRITICAL CARE MEDICINE   Name: Jeffrey Humphrey MRN: [id number] DOB: 1956/02/05    ADMISSION DATE:  07/11/2014 CONSULTATION DATE:  07/12/2014  REFERRING MD :  EDP  CHIEF COMPLAINT: seizure  INITIAL PRESENTATION: 58 year old male from home with seizure like activity. Required large amounts of benzo via EMS. In ED was obtunded with questionable airway protection. Reportedly drinks 10 beers per day. Hyponatremic 113 in ED. CVL placed and 3% started. PCCM asked to see for admission.   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  9/18 eeg>>>neg focus, enceph noted  SIGNIFICANT EVENTS: 9/18 admitted for seizures.   SUBJECTIVE: sedated  VITAL SIGNS: Temp:  [97.6 F (36.4 C)-98.5 F (36.9 C)] 98.2 F (36.8 C) (09/18 1215) Pulse Rate:  [59-104] 63 (09/18 1415) Resp:  [12-20] 20 (09/18 1415) BP: (85-167)/(52-97) 106/68 mmHg (09/18 1415) SpO2:  [95 %-100 %] 100 % (09/18 1415) FiO2 (%):  [40 %-60 %] 40 % (09/18 1203) Weight:  [110.6 kg (243 lb 13.3 oz)] 110.6 kg (243 lb 13.3 oz) (09/18 0330) HEMODYNAMICS:   VENTILATOR SETTINGS: Vent Mode:  [-] PRVC FiO2 (%):  [40 %-60 %] 40 % Set Rate:  [20 bmp] 20 bmp Vt Set:  [620 mL] 620 mL PEEP:  [5 cmH20] 5 cmH20 Plateau Pressure:  [21 cmH20-22 cmH20] 22 cmH20 INTAKE / OUTPUT:  Intake/Output Summary (Last 24 hours) at 07/12/14 1431 Last data filed at 07/12/14 1400  Gross per 24 hour  Intake 1082.64 ml  Output   1345 ml  Net -262.36 ml    PHYSICAL EXAMINATION: General:  Obese male in bed, rass -2 Neuro:  Moved all ext, perrl 2 mm HEENT:  [place], C-collar in place. No JVD noted Cardiovascular:  Borderline tachy, regular, no MRG Lungs:  coarse Abdomen:  Obese, non-tender, non-distended.  Musculoskeletal:  No acute deformity Skin:  Intact  LABS:  CBC  Recent Labs Lab 07/11/14 2326 07/12/14 0201 07/12/14 0211 07/12/14 0800  WBC 15.6* 13.4*  --  12.5*  HGB 13.2 13.0  14.6 12.8*  HCT 34.6* 35.0* 43.0 34.3*  PLT 276 248  --  249   Coag's  Recent Labs Lab 07/12/14 0800  INR 1.07   BMET  Recent Labs Lab 07/12/14 0345 07/12/14 0800 07/12/14 1132  NA 117* 117* 123*  K 3.4* 3.5* 3.3*  CL 77* 79* 87*  CO2 BUN CREATININE 0.66 0.69 0.71  GLUCOSE 117* 124* 112*   Electrolytes  Recent Labs Lab 07/12/14 0345 07/12/14 0800 07/12/14 1132  CALCIUM 8.1* 8.5 8.2*  MG 1.9  --   --   PHOS 2.7  --   --    Sepsis Markers  Recent Labs Lab 07/11/14 2334 07/12/14 0205 07/12/14 0800 07/12/14 1132  LATICACIDVEN 2.30*  --  2.0 1.0  PROCALCITON  --  0.12  --   --    ABG  Recent Labs Lab 07/12/14 0449 07/12/14 0610  PHART 7.243* 7.392  PCO2ART 68.1* 41.1  PO2ART 91.0 178.0*   Liver Enzymes  Recent Labs Lab 07/11/14 2326 07/12/14 0800  AST 114* 111*  ALT 130* 121*  ALKPHOS 90 88  BILITOT 0.7 0.7  ALBUMIN 3.4* 3.3*   Cardiac Enzymes  Recent Labs Lab 07/12/14 0800  PROBNP 367.2*   Glucose  Recent Labs Lab 07/12/14 0409 07/12/14 0813 07/12/14 1212  GLUCAP 134* 116* 109*    Imaging No results found.  ASSESSMENT / PLAN:  PULMONARY A:  Acute hypercarbic respiratory failure in setting of encephalopathy, post ictal CAP OSA, likley obesity hypoventilation syndrome  P:   ABg reviewed keep same MV pcxr in am for volume, some increase in int markings noted In am will sbt, correcting na   CARDIOVASCULAR RIJ CVL 9/18 >>> A:  H/o hypertension  P:  Continuous cardiac monitoring Hold home anti-hypertensives Trend lactic acid - reassuring Assess cvp with 3% usage With increase int makings on 3%, worry is pulm edema if has cardiomyopathy = echo  RENAL A:   Hyponatremia - ? Beer potomania  Hypocalcemia Correction noted now to be fast, assume some chronicity to Na hypoK P:   Careful sodium repletion Hypertonic 3% saline hold for 2 hrs and reassess bmet, likely restart at 20 cc/hr May  need free water back, keep bmet at q2h Close neuro assessment for CPD supp k  Saline kvo  GASTROINTESTINAL A:   H/o GERD  P:   IV PPI Start TF  HEMATOLOGIC A:   Oral bleeding, tongue injury s/p fall.  Leukocytosis dvt prev   P:  Monitor CBC VTE ppx, SCD Start sub q hep as ct head neg  INFECTIOUS A:   CAP- ? Aspiration  P:   BCx2 9/18 >>> Sputum 9/18 >>> Abx: unasyn, start date 9/18>>>9/18 Ceftriaxone 9/18>>> azithr 9/18>>> Send leg urine, strep  ENDOCRINE A:   No acute issues   P:   Follow glucose on chemistry  NEUROLOGIC A:   Seizure Acute encehalopathy, multifactorial - hyponatremia, OD, DTs? Vent dyshrony P:   RASS goal: 0 eeg neg focus Sedation with propofol gtt, prn fentanyl Thiamine Folic acid Daily WUA NO CIWA  TODAY'S SUMMARY: EEG neg, slow na correction, may need free water back, no wean till am    I have personally obtained a history, examined the patient, evaluated laboratory and imaging results, formulated the assessment and plan and placed orders. CRITICAL CARE: The patient is critically ill with multiple organ systems failure and requires high complexity decision making for assessment and support, frequent evaluation and titration of therapies, application of advanced monitoring technologies and extensive interpretation of multiple databases. Critical Care Time devoted to patient care services described in this note is 40  minutes.   Mcarthur Rossetti. Tyson Alias, MD, FACP Pgr: [phone] [facility]

## 2014-07-12 NOTE — Progress Notes (Signed)
eLink Physician-Brief Progress Note Patient Name: Jeffrey Humphrey DOB: [birth year] MRN: [id number]   Date of Service  07/12/2014  HPI/Events of Note  hyponatremia  eICU Interventions  Restart hypertonic saline at 41ml/hr. Cont q2h bmp     Intervention Category Major Interventions: Electrolyte abnormality - evaluation and management  GIDDINGS, OLIVIA K. 07/12/2014, 8:25 PM

## 2014-07-12 NOTE — Progress Notes (Signed)
  Echocardiogram 2D Echocardiogram has been performed.  Georgian Co 07/12/2014, 5:09 PM

## 2014-07-12 NOTE — Progress Notes (Signed)
EEG Completed; Results Pending  

## 2014-07-12 NOTE — ED Notes (Signed)
Dr. Mora Bellman at bedside placing central line

## 2014-07-12 NOTE — Progress Notes (Signed)
INITIAL NUTRITION ASSESSMENT  DOCUMENTATION CODES Per approved criteria  -Obesity Unspecified   INTERVENTION:  Utilize 76M PEPuP Protocol: initiate TF via OGT with Vital High Protein at 25 ml/h and Prostat 30 ml QID on day 1; on day 2, increase to goal rate of 35 ml/h (840 ml per day) to provide 1240 kcals, 134 gm protein, 702 ml free water daily.  Above TF regimen plus current Propofol will provide a total of 1678 kcals (25 kcals/kg ideal weight), 134 gm protein (99% of estimated needs), 702 ml free water daily.  NUTRITION DIAGNOSIS: Inadequate oral intake related to inability to eat as evidenced by NPO status.   Goal: Enteral nutrition to provide 60-70% of estimated calorie needs (22-25 kcals/kg ideal body weight) and 100% of estimated protein needs, based on ASPEN guidelines for permissive underfeeding in critically ill obese individuals  Monitor:  TF tolerance/adequacy, weight trend, labs, vent status.  Reason for Assessment: MD Consult for TF initiation and management.  58 y.o. male  Admitting Dx: Seizure  ASSESSMENT: 58 year old male from home with seizure like activity. Required large amounts of benzo via EMS. In ED was obtunded with questionable airway protection. Reportedly drinks 10 beers per day. Hyponatremic 113 in ED.  Discussed patient in ICU rounds today. Nutrition focused physical exam completed.  No muscle or subcutaneous fat depletion noticed. Received MD Consult for TF initiation and management.  Patient is currently intubated on ventilator support MV: 12.2 L/min Temp (24hrs), Avg:98 F (36.7 C), Min:97.6 F (36.4 C), Max:98.5 F (36.9 C)  Propofol: 16.6 ml/hr providing 438 kcals per day.  Height: Ht Readings from Last 1 Encounters:  07/12/14  (1.702 m)    Weight: Wt Readings from Last 1 Encounters:  07/12/14 243 lb 13.3 oz (110.6 kg)    Ideal Body Weight: 67.3 kg  % Ideal Body Weight: 164%  Wt Readings from Last 10 Encounters:  07/12/14  243 lb 13.3 oz (110.6 kg)    Usual Body Weight: unknown  % Usual Body Weight: N/A  BMI:  Body mass index is 38.18 kg/(m^2).  Estimated Nutritional Needs: Kcal: 2151 Protein: 135-165 gm Fluid: 2.2 L  Skin: no issues  Diet Order: NPO  EDUCATION NEEDS: -Education not appropriate at this time   Intake/Output Summary (Last 24 hours) at 07/12/14 1530 Last data filed at 07/12/14 1400  Gross per 24 hour  Intake 1082.64 ml  Output   1345 ml  Net -262.36 ml    Last BM: None documented since admission   Labs:   Recent Labs Lab 07/12/14 0211 07/12/14 0345 07/12/14 0800 07/12/14 1132  NA 113* 117* 117* 123*  K 4.1 3.4* 3.5* 3.3*  CL 77* 77* 79* 87*  CO2  --  BUN CREATININE 0.70 0.66 0.69 0.71  CALCIUM  --  8.1* 8.5 8.2*  MG  --  1.9  --   --   PHOS  --  2.7  --   --   GLUCOSE 109* 117* 124* 112*    CBG (last 3)   Recent Labs  07/12/14 0409 07/12/14 0813 07/12/14 1212  GLUCAP 134* 116* 109*    Scheduled Meds: . antiseptic oral rinse  7 mL Mouth Rinse QID  . azithromycin  500 mg Intravenous Q24H  . cefTRIAXone (ROCEPHIN)  IV  1 g Intravenous Q24H  . chlorhexidine  15 mL Mouth Rinse BID  . etomidate  20 mg Intravenous Once  . feeding supplement (  PRO-STAT SUGAR FREE 64)  30 mL Per Tube BID  . feeding supplement (VITAL HIGH PROTEIN)  1,000 mL Per Tube Q24H  . folic acid  1 mg Intravenous Daily  . heparin subcutaneous  5,000 Units Subcutaneous 3 times per day  . LORazepam  1 mg Intravenous 3 times per day  . pantoprazole (PROTONIX) IV  40 mg Intravenous QHS  . thiamine IV  100 mg Intravenous Daily    Continuous Infusions: . propofol 25 mcg/kg/min (07/12/14 1105)  . sodium chloride (hypertonic) Stopped (07/12/14 1440)    Past Medical History  Diagnosis Date  . HTN (hypertension)   . Depression   . GERD (gastroesophageal reflux disease)     History reviewed. No pertinent past surgical history.  Joaquin Courts, RD, LDN,  CNSC Pager [phone] After Hours Pager [phone]

## 2014-07-13 LAB — BASIC METABOLIC PANEL
ANION GAP: 13 (ref 5–15)
Anion gap: 11 (ref 5–15)
Anion gap: 13 (ref 5–15)
Anion gap: 11 (ref 5–15)
Anion gap: 12 (ref 5–15)
BUN: 11 mg/dL (ref 6–23)
BUN: 11 mg/dL (ref 6–23)
BUN: 11 mg/dL (ref 6–23)
BUN: 10 mg/dL (ref 6–23)
BUN: 9 mg/dL (ref 6–23)
CHLORIDE: 90 meq/L — AB (ref 96–112)
CO2: 25 meq/L (ref 19–32)
CO2: 23 meq/L (ref 19–32)
CO2: 25 mEq/L (ref 19–32)
CO2: 25 mEq/L (ref 19–32)
CO2: 26 mEq/L (ref 19–32)
Calcium: 8.5 mg/dL (ref 8.4–10.5)
Calcium: 8.6 mg/dL (ref 8.4–10.5)
Calcium: 8.6 mg/dL (ref 8.4–10.5)
Calcium: 9 mg/dL (ref 8.4–10.5)
Calcium: 9 mg/dL (ref 8.4–10.5)
Chloride: 92 mEq/L — ABNORMAL LOW (ref 96–112)
Chloride: 90 mEq/L — ABNORMAL LOW (ref 96–112)
Chloride: 91 mEq/L — ABNORMAL LOW (ref 96–112)
Chloride: 89 mEq/L — ABNORMAL LOW (ref 96–112)
Creatinine, Ser: 0.72 mg/dL (ref 0.50–1.35)
Creatinine, Ser: 0.67 mg/dL (ref 0.50–1.35)
Creatinine, Ser: 0.68 mg/dL (ref 0.50–1.35)
Creatinine, Ser: 0.69 mg/dL (ref 0.50–1.35)
Creatinine, Ser: 0.69 mg/dL (ref 0.50–1.35)
GFR calc Af Amer: >90 mL/min (ref >90)
GFR calc Af Amer: >90 mL/min (ref >90)
GFR calc Af Amer: >90 mL/min (ref >90)
GFR calc non Af Amer: >90 mL/min (ref >90)
GFR calc non Af Amer: >90 mL/min (ref >90)
GLUCOSE: 119 mg/dL — AB (ref 70–99)
GLUCOSE: 119 mg/dL — AB (ref 70–99)
GLUCOSE: 155 mg/dL — AB (ref 70–99)
Glucose, Bld: 113 mg/dL — ABNORMAL HIGH (ref 70–99)
Glucose, Bld: 161 mg/dL — ABNORMAL HIGH (ref 70–99)
POTASSIUM: 3.4 meq/L — AB (ref 3.7–5.3)
POTASSIUM: 3.3 meq/L — AB (ref 3.7–5.3)
POTASSIUM: 3.1 meq/L — AB (ref 3.7–5.3)
POTASSIUM: 3.6 meq/L — AB (ref 3.7–5.3)
POTASSIUM: 3.5 meq/L — AB (ref 3.7–5.3)
SODIUM: 128 meq/L — AB (ref 137–147)
SODIUM: 126 meq/L — AB (ref 137–147)
SODIUM: 127 meq/L — AB (ref 137–147)
Sodium: 126 mEq/L — ABNORMAL LOW (ref 137–147)
Sodium: 129 mEq/L — ABNORMAL LOW (ref 137–147)

## 2014-07-13 LAB — CBC
HCT: 33.8 % — ABNORMAL LOW (ref 39.0–52.0)
Hemoglobin: 12.2 g/dL — ABNORMAL LOW (ref 13.0–17.0)
MCH: 33.7 pg (ref 26.0–34.0)
MCHC: 36.1 g/dL — ABNORMAL HIGH (ref 30.0–36.0)
MCV: 93.4 fL (ref 78.0–100.0)
Platelets: 210 10*3/uL (ref 150–400)
RBC: 3.62 MIL/uL — AB (ref 4.22–5.81)
RDW: 12.6 % (ref 11.5–15.5)
WBC: 7.4 10*3/uL (ref 4.0–10.5)

## 2014-07-13 LAB — GLUCOSE, CAPILLARY
GLUCOSE-CAPILLARY: 107 mg/dL — AB (ref 70–99)
GLUCOSE-CAPILLARY: 132 mg/dL — AB (ref 70–99)
GLUCOSE-CAPILLARY: 134 mg/dL — AB (ref 70–99)
GLUCOSE-CAPILLARY: 136 mg/dL — AB (ref 70–99)
Glucose-Capillary: 127 mg/dL — ABNORMAL HIGH (ref 70–99)
Glucose-Capillary: 111 mg/dL — ABNORMAL HIGH (ref 70–99)

## 2014-07-13 LAB — URINE CULTURE
COLONY COUNT: NO GROWTH
Culture: NO GROWTH

## 2014-07-13 LAB — LEGIONELLA ANTIGEN, URINE: Legionella Antigen, Urine: NEGATIVE

## 2014-07-13 LAB — MAGNESIUM: MAGNESIUM: 1.9 mg/dL (ref 1.5–2.5)

## 2014-07-13 LAB — PHOSPHORUS: Phosphorus: 1.8 mg/dL — ABNORMAL LOW (ref 2.3–4.6)

## 2014-07-13 MED ORDER — POTASSIUM CHLORIDE 20 MEQ/15ML (10%) PO LIQD
40.0000 meq | Freq: Once | ORAL | Status: AC
  Administered 2014-07-13: 40 meq
  Filled 2014-07-13 (×3): qty 30

## 2014-07-13 MED ORDER — IPRATROPIUM-ALBUTEROL 0.5-2.5 (3) MG/3ML IN SOLN
RESPIRATORY_TRACT | Status: AC
  Filled 2014-07-13: qty 3

## 2014-07-13 MED ORDER — POTASSIUM CHLORIDE 20 MEQ/15ML (10%) PO LIQD
40.0000 meq | Freq: Once | ORAL | Status: AC
  Administered 2014-07-13: 40 meq via ORAL

## 2014-07-13 MED ORDER — IPRATROPIUM-ALBUTEROL 0.5-2.5 (3) MG/3ML IN SOLN
3.0000 mL | Freq: Four times a day (QID) | RESPIRATORY_TRACT | Status: DC
  Administered 2014-07-13 – 2014-07-15 (×7): 3 mL via RESPIRATORY_TRACT
  Filled 2014-07-13 (×6): qty 3

## 2014-07-13 MED ORDER — POTASSIUM CHLORIDE 10 MEQ/50ML IV SOLN
10.0000 meq | INTRAVENOUS | Status: AC
  Administered 2014-07-13 (×2): 10 meq via INTRAVENOUS
  Filled 2014-07-13 (×2): qty 50

## 2014-07-13 MED ORDER — LORAZEPAM 2 MG/ML IJ SOLN
1.0000 mg | INTRAMUSCULAR | Status: DC | PRN
  Filled 2014-07-13: qty 1

## 2014-07-13 MED ORDER — CETYLPYRIDINIUM CHLORIDE 0.05 % MT LIQD
7.0000 mL | Freq: Two times a day (BID) | OROMUCOSAL | Status: DC
  Administered 2014-07-13 – 2014-07-17 (×6): 7 mL via OROMUCOSAL

## 2014-07-13 NOTE — Progress Notes (Signed)
[facility] ADULT ICU REPLACEMENT PROTOCOL FOR AM LAB REPLACEMENT ONLY  The patient does apply for the [facility] Adult ICU Electrolyte Replacment Protocol based on the criteria listed below:   1. Is GFR >/= 40 ml/min? Yes.    Patient's GFR today is >90 2. Is urine output >/= 0.5 ml/kg/hr for the last 6 hours? Yes.   Patient's UOP is 0.53 ml/kg/hr 3. Is BUN < 60 mg/dL? Yes.    Patient's BUN today is 11 4. Abnormal electrolyte(s): Potassium 3.3 5. Ordered repletion with: Electrolytes replacement protocol 6. If a panic level lab has been reported, has the CCM MD in charge been notified? Yes.  .   Physician:  Dr. Sandi Carne  [facility], Alda Berthold E 07/13/2014 6:04 AM

## 2014-07-13 NOTE — Procedures (Signed)
Extubation Procedure Note  Patient Details:   Name: Teagon Kron DOB: 03-16-1956 MRN: [id number]   Airway Documentation:     Evaluation  O2 sats: stable throughout Complications: No apparent complications Patient did tolerate procedure well. Bilateral Breath Sounds: Diminished   Yes Cuff leak positive Placed 4l/in [place], vocalizes well, unable to perform Incentive spirometry at this time.  Newt Lukes 07/13/2014, 11:08 AM

## 2014-07-13 NOTE — Progress Notes (Signed)
PULMONARY / CRITICAL CARE MEDICINE   Name: Jeffrey Humphrey MRN: [id number] DOB: 03/26/1956    ADMISSION DATE:  07/11/2014 CONSULTATION DATE:  07/12/2014  REFERRING MD :  EDP  CHIEF COMPLAINT: seizure  INITIAL PRESENTATION: 58 year old male from home with seizure like activity. Required large amounts of benzo via EMS. In ED was obtunded with questionable airway protection. Reportedly drinks 10 beers per day. Hyponatremic 113 in ED. CVL placed and 3% started. PCCM asked to see for admission.   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  9/18 eeg>>>neg focus, enceph noted  SIGNIFICANT EVENTS: 9/18 admitted for seizures.   SUBJECTIVE: awake on propofol gtt Afebrile Good UO  VITAL SIGNS: Temp:  [97.9 F (36.6 C)-99 F (37.2 C)] 98.4 F (36.9 C) (09/19 0800) Pulse Rate:  [59-92] 64 (09/19 0730) Resp:  [15-20] 20 (09/19 0730) BP: (84-150)/(41-90) 143/76 mmHg (09/19 0730) SpO2:  [99 %-100 %] 100 % (09/19 0730) FiO2 (%):  [40 %] 40 % (09/19 0759) Weight:  [117.7 kg (259 lb 7.7 oz)] 117.7 kg (259 lb 7.7 oz) (09/19 0456) HEMODYNAMICS: CVP:  [9 mmHg-16 mmHg] 16 mmHg VENTILATOR SETTINGS: Vent Mode:  [-] PRVC FiO2 (%):  [40 %] 40 % Set Rate:  [20 bmp] 20 bmp Vt Set:  [620 mL] 620 mL PEEP:  [5 cmH20] 5 cmH20 Plateau Pressure:  [22 cmH20-26 cmH20] 24 cmH20 INTAKE / OUTPUT:  Intake/Output Summary (Last 24 hours) at 07/13/14 0949 Last data filed at 07/13/14 0900  Gross per 24 hour  Intake 2365.56 ml  Output   1650 ml  Net 715.56 ml    PHYSICAL EXAMINATION: General:  Obese male in bed, rass -1 to +2 Neuro:  Moved all ext, perrl 2 mm HEENT:  , C-collar in place. No JVD noted Cardiovascular:  Borderline tachy, regular, no MRG Lungs:  coarse Abdomen:  Obese, non-tender, non-distended.  Musculoskeletal:  No acute deformity Skin:  Intact  LABS:  CBC  Recent Labs Lab 07/12/14 0201 07/12/14 0211 07/12/14 0800  07/13/14 0458  WBC 13.4*  --  12.5* 7.4  HGB 13.0 14.6 12.8* 12.2*  HCT 35.0* 43.0 34.3* 33.8*  PLT 248  --  249 210   Coag's  Recent Labs Lab 07/12/14 0800  INR 1.07   BMET  Recent Labs Lab 07/13/14 0155 07/13/14 0458 07/13/14 0732  NA 126* 128* 126*  K 3.4* 3.3* 3.1*  CL 90* 92* 90*  CO2 BUN CREATININE 0.72 0.67 0.68  GLUCOSE 119* 119* 155*   Electrolytes  Recent Labs Lab 07/12/14 0345  07/12/14 1444  07/13/14 0155 07/13/14 0156 07/13/14 0458 07/13/14 0732  CALCIUM 8.1*  < >  --   < > 8.5  --  8.6 8.6  MG 1.9  --  2.2  --   --  1.9  --   --   PHOS 2.7  --  1.4*  --   --  1.8*  --   --   < > = values in this interval not displayed. Sepsis Markers  Recent Labs Lab 07/11/14 2334 07/12/14 0205 07/12/14 0800 07/12/14 1132 07/12/14 1732  LATICACIDVEN 2.30*  --  2.0 1.0 1.1  PROCALCITON  --  0.12  --   --   --    ABG  Recent Labs Lab 07/12/14 0449 07/12/14 0610  PHART 7.243* 7.392  PCO2ART 68.1* 41.1  PO2ART 91.0 178.0*  Liver Enzymes  Recent Labs Lab 07/11/14 2326 07/12/14 0800  AST 114* 111*  ALT 130* 121*  ALKPHOS 90 88  BILITOT 0.7 0.7  ALBUMIN 3.4* 3.3*   Cardiac Enzymes  Recent Labs Lab 07/12/14 0800  PROBNP 367.2*   Glucose  Recent Labs Lab 07/12/14 1212 07/12/14 1605 07/12/14 2022 07/12/14 2344 07/13/14 0356 07/13/14 0721  GLUCAP 109* 110* 124* 136* 127* 134*    Imaging Ct Head Wo Contrast  07/12/2014   CLINICAL DATA:  Altered mental status, seizure  EXAM: CT HEAD WITHOUT CONTRAST  CT CERVICAL SPINE WITHOUT CONTRAST  TECHNIQUE: Multidetector CT imaging of the head and cervical spine was performed following the standard protocol without intravenous contrast. Multiplanar CT image reconstructions of the cervical spine were also generated.  COMPARISON:  None.  FINDINGS: CT HEAD FINDINGS  There is no acute intracranial hemorrhage or infarct. No mass lesion or midline shift. Gray-white matter  differentiation is well maintained. Ventricles are normal in size without evidence of hydrocephalus. CSF containing spaces are within normal limits. No extra-axial fluid collection.  The calvarium is intact.  Orbital soft tissues are within normal limits.  The paranasal sinuses and mastoid air cells are well pneumatized and free of fluid.  Scalp soft tissues are unremarkable.  CT CERVICAL SPINE FINDINGS  Study is degraded by motion artifact.  There is reversal of the normal cervical lordosis with apex at C4-5. No listhesis. Vertebral body heights are preserved. Normal C1-2 articulations are intact. There is fullness of the prevertebral soft tissues with the internal carotid arteries medialized into the retropharyngeal space. No retropharyngeal hematoma or other abnormality. No acute fracture or listhesis.  Advanced multilevel degenerative disc disease is evidenced by intervertebral disc space narrowing, endplate sclerosis, and osteophytosis is seen throughout the cervical spine, most evident at C4-5, C5-6, and C6-7.  Visualized soft tissues of the neck are within normal limits. Visualized lung apices are clear without evidence of apical pneumothorax.  IMPRESSION: CT BRAIN:  No acute intracranial process.  CT CERVICAL SPINE:  1. Motion degraded study with no definite acute traumatic injury within the cervical spine. 2. Reversal of the normal cervical lordosis with moderate multilevel degenerative disc disease, most severe at C4-5 through C6-7.   Electronically Signed   By: Rise Mu M.D.   On: 07/12/2014 01:28   Ct Cervical Spine Wo Contrast  07/12/2014   CLINICAL DATA:  Altered mental status, seizure  EXAM: CT HEAD WITHOUT CONTRAST  CT CERVICAL SPINE WITHOUT CONTRAST  TECHNIQUE: Multidetector CT imaging of the head and cervical spine was performed following the standard protocol without intravenous contrast. Multiplanar CT image reconstructions of the cervical spine were also generated.  COMPARISON:   None.  FINDINGS: CT HEAD FINDINGS  There is no acute intracranial hemorrhage or infarct. No mass lesion or midline shift. Gray-white matter differentiation is well maintained. Ventricles are normal in size without evidence of hydrocephalus. CSF containing spaces are within normal limits. No extra-axial fluid collection.  The calvarium is intact.  Orbital soft tissues are within normal limits.  The paranasal sinuses and mastoid air cells are well pneumatized and free of fluid.  Scalp soft tissues are unremarkable.  CT CERVICAL SPINE FINDINGS  Study is degraded by motion artifact.  There is reversal of the normal cervical lordosis with apex at C4-5. No listhesis. Vertebral body heights are preserved. Normal C1-2 articulations are intact. There is fullness of the prevertebral soft tissues with the internal carotid arteries medialized into the retropharyngeal space. No retropharyngeal  hematoma or other abnormality. No acute fracture or listhesis.  Advanced multilevel degenerative disc disease is evidenced by intervertebral disc space narrowing, endplate sclerosis, and osteophytosis is seen throughout the cervical spine, most evident at C4-5, C5-6, and C6-7.  Visualized soft tissues of the neck are within normal limits. Visualized lung apices are clear without evidence of apical pneumothorax.  IMPRESSION: CT BRAIN:  No acute intracranial process.  CT CERVICAL SPINE:  1. Motion degraded study with no definite acute traumatic injury within the cervical spine. 2. Reversal of the normal cervical lordosis with moderate multilevel degenerative disc disease, most severe at C4-5 through C6-7.   Electronically Signed   By: Rise Mu M.D.   On: 07/12/2014 01:28   Dg Chest Port 1 View  07/12/2014   CLINICAL DATA:  Endotracheal tube placement.  EXAM: PORTABLE CHEST - 1 VIEW  COMPARISON:  Chest radiograph performed earlier today at 1:30 a.m.  FINDINGS: The patient's endotracheal tube is seen ending 2-3 cm above the  carina. An enteric tube noted extending below the diaphragm. A right IJ line is noted ending about the mid SVC.  The lungs are hypoexpanded. Left basilar airspace opacity may reflect atelectasis or pneumonia. Vascular congestion and vascular crowding are seen. A small left pleural effusion is suspected. No pneumothorax is seen.  The cardiomediastinal silhouette is borderline enlarged. No acute osseous abnormalities are identified.  IMPRESSION: 1. Endotracheal tube seen ending 2-3 cm above the carina. 2. Lungs hypoexpanded. Left basilar airspace opacity may reflect atelectasis or pneumonia. 3. Vascular congestion and borderline cardiomegaly. Small left pleural effusion suspected.   Electronically Signed   By: Roanna Raider M.D.   On: 07/12/2014 05:43   Dg Chest Portable 1 View  07/12/2014   CLINICAL DATA:  Central line placement.  EXAM: PORTABLE CHEST - 1 VIEW  COMPARISON:  None.  FINDINGS: Right IJ central line is in place, tip overlying the level of the superior vena cava. The patient is rotated towards the left. The heart is enlarged. No evidence for edema. No pneumothorax.  IMPRESSION: Right IJ central line tip overlying the superior vena cava.   Electronically Signed   By: Rosalie Gums M.D.   On: 07/12/2014 01:50     ASSESSMENT / PLAN:  PULMONARY A:  Acute hypercarbic respiratory failure in setting of encephalopathy CAP OSA, likley obesity hypoventilation syndrome  P:   SBTs with goal extubation  CARDIOVASCULAR RIJ CVL 9/18 >>> A:  H/o hypertension Echo - EF 55% P:  Resume home anti-hypertensives Concern for edema with 3% usage   RENAL A:   Hyponatremia - ? Beer potomania ,Has some chronicity to Na 125 range Hypocalcemia hypoK P:   Dc Hypertonic 3% saline  bmet at q6h, restart if drops supp k    GASTROINTESTINAL A:   H/o GERD  P:   IV PPI   HEMATOLOGIC A:   Oral bleeding, tongue injury s/p fall.  Leukocytosis dvt prev   P:  Monitor CBC VTE ppx, SCD Start  sub q hep as ct head neg  INFECTIOUS A:   CAP- ? Aspiration  P:   BCx2 9/18 >>> Sputum 9/18 >>> strep neg Abx: unasyn, start date 9/18>>>9/18 Ceftriaxone 9/18>>> azithr 9/18>>> Send leg urine,   ENDOCRINE A:   No acute issues   P:   Follow glucose on chemistry  NEUROLOGIC A:   Seizure, eeg neg focus Acute encehalopathy, multifactorial - hyponatremia, OD, DTs? Vent dyshrony P:   RASS goal: 0 Sedation with propofol gtt,  prn fentanyl Thiamine Folic acid Daily WUA High risk withdrawal  TODAY'S SUMMARY: EEG neg, slow na correction, hope to extubate    I have personally obtained a history, examined the patient, evaluated laboratory and imaging results, formulated the assessment and plan and placed orders. CRITICAL CARE: The patient is critically ill with multiple organ systems failure and requires high complexity decision making for assessment and support, frequent evaluation and titration of therapies, application of advanced monitoring technologies and extensive interpretation of multiple databases. Critical Care Time devoted to patient care services described in this note is 35  minutes.    Cyril Mourning MD. Tonny Bollman. [facility] Pager [phone] If no response call [phone]

## 2014-07-14 ENCOUNTER — Encounter (HOSPITAL_COMMUNITY): Payer: Self-pay | Admitting: *Deleted

## 2014-07-14 DIAGNOSIS — J9600 Acute respiratory failure, unspecified whether with hypoxia or hypercapnia: Secondary | ICD-10-CM

## 2014-07-14 DIAGNOSIS — J189 Pneumonia, unspecified organism: Secondary | ICD-10-CM

## 2014-07-14 DIAGNOSIS — E871 Hypo-osmolality and hyponatremia: Principal | ICD-10-CM

## 2014-07-14 LAB — BASIC METABOLIC PANEL
ANION GAP: 12 (ref 5–15)
ANION GAP: 12 (ref 5–15)
Anion gap: 11 (ref 5–15)
Anion gap: 12 (ref 5–15)
BUN: 8 mg/dL (ref 6–23)
BUN: 7 mg/dL (ref 6–23)
BUN: 8 mg/dL (ref 6–23)
BUN: 8 mg/dL (ref 6–23)
CALCIUM: 8.8 mg/dL (ref 8.4–10.5)
CALCIUM: 9 mg/dL (ref 8.4–10.5)
CO2: 27 mEq/L (ref 19–32)
CO2: 28 meq/L (ref 19–32)
CO2: 28 meq/L (ref 19–32)
CO2: 28 mEq/L (ref 19–32)
Calcium: 9.1 mg/dL (ref 8.4–10.5)
Calcium: 8.9 mg/dL (ref 8.4–10.5)
Chloride: 87 mEq/L — ABNORMAL LOW (ref 96–112)
Chloride: 89 mEq/L — ABNORMAL LOW (ref 96–112)
Chloride: 89 mEq/L — ABNORMAL LOW (ref 96–112)
Chloride: 88 mEq/L — ABNORMAL LOW (ref 96–112)
Creatinine, Ser: 0.65 mg/dL (ref 0.50–1.35)
Creatinine, Ser: 0.67 mg/dL (ref 0.50–1.35)
Creatinine, Ser: 0.73 mg/dL (ref 0.50–1.35)
Creatinine, Ser: 0.76 mg/dL (ref 0.50–1.35)
GFR calc Af Amer: >90 mL/min (ref >90)
GFR calc Af Amer: >90 mL/min (ref >90)
Glucose, Bld: 103 mg/dL — ABNORMAL HIGH (ref 70–99)
Glucose, Bld: 132 mg/dL — ABNORMAL HIGH (ref 70–99)
Glucose, Bld: 144 mg/dL — ABNORMAL HIGH (ref 70–99)
Glucose, Bld: 110 mg/dL — ABNORMAL HIGH (ref 70–99)
POTASSIUM: 3.4 meq/L — AB (ref 3.7–5.3)
Potassium: 3.7 mEq/L (ref 3.7–5.3)
Potassium: 3.7 mEq/L (ref 3.7–5.3)
Potassium: 3.4 mEq/L — ABNORMAL LOW (ref 3.7–5.3)
SODIUM: 125 meq/L — AB (ref 137–147)
SODIUM: 129 meq/L — AB (ref 137–147)
SODIUM: 129 meq/L — AB (ref 137–147)
SODIUM: 128 meq/L — AB (ref 137–147)

## 2014-07-14 LAB — GLUCOSE, CAPILLARY
Glucose-Capillary: 110 mg/dL — ABNORMAL HIGH (ref 70–99)
Glucose-Capillary: 142 mg/dL — ABNORMAL HIGH (ref 70–99)
Glucose-Capillary: 97 mg/dL (ref 70–99)

## 2014-07-14 LAB — CBC
HCT: 31.5 % — ABNORMAL LOW (ref 39.0–52.0)
Hemoglobin: 11.2 g/dL — ABNORMAL LOW (ref 13.0–17.0)
MCH: 33.7 pg (ref 26.0–34.0)
MCHC: 35.6 g/dL (ref 30.0–36.0)
MCV: 94.9 fL (ref 78.0–100.0)
Platelets: 225 10*3/uL (ref 150–400)
RBC: 3.32 MIL/uL — ABNORMAL LOW (ref 4.22–5.81)
RDW: 12.6 % (ref 11.5–15.5)
WBC: 8.6 10*3/uL (ref 4.0–10.5)

## 2014-07-14 LAB — CULTURE, RESPIRATORY: CULTURE: NORMAL

## 2014-07-14 LAB — CULTURE, RESPIRATORY W GRAM STAIN

## 2014-07-14 MED ORDER — SODIUM CHLORIDE 3 % IV SOLN
INTRAVENOUS | Status: DC
  Administered 2014-07-14: 20 mL/h via INTRAVENOUS
  Filled 2014-07-14 (×2): qty 500

## 2014-07-14 MED ORDER — POTASSIUM CHLORIDE 10 MEQ/50ML IV SOLN
10.0000 meq | INTRAVENOUS | Status: AC
  Administered 2014-07-14 (×2): 10 meq via INTRAVENOUS
  Filled 2014-07-14: qty 50

## 2014-07-14 MED ORDER — PANTOPRAZOLE SODIUM 40 MG PO TBEC
40.0000 mg | DELAYED_RELEASE_TABLET | Freq: Every day | ORAL | Status: DC
  Administered 2014-07-14 – 2014-07-18 (×5): 40 mg via ORAL
  Filled 2014-07-14 (×4): qty 1

## 2014-07-14 MED ORDER — VITAMIN B-1 100 MG PO TABS
100.0000 mg | ORAL_TABLET | Freq: Every day | ORAL | Status: DC
  Administered 2014-07-14 – 2014-07-18 (×5): 100 mg via ORAL
  Filled 2014-07-14 (×5): qty 1

## 2014-07-14 MED ORDER — ALPRAZOLAM 0.5 MG PO TABS: 0.5000 mg | ORAL_TABLET | Freq: Two times a day (BID) | ORAL | Status: DC | PRN

## 2014-07-14 MED ORDER — DILTIAZEM HCL 100 MG IV SOLR
5.0000 mg/h | INTRAVENOUS | Status: DC
  Administered 2014-07-14: 5 mg/h via INTRAVENOUS
  Administered 2014-07-15: 10 mg/h via INTRAVENOUS
  Administered 2014-07-16: 5 mg/h via INTRAVENOUS
  Filled 2014-07-14 (×2): qty 100

## 2014-07-14 NOTE — Progress Notes (Signed)
eLink Physician-Brief Progress Note Patient Name: Jeffrey Humphrey DOB: [birth year] MRN: [id number]   Date of Service  07/14/2014  HPI/Events of Note  Pt in afib rVR  eICU Interventions  cardiazem ordered     Intervention Category Major Interventions: Arrhythmia - evaluation and management  Shan Levans 07/14/2014, 10:45 PM

## 2014-07-14 NOTE — Progress Notes (Signed)
eLink Physician-Brief Progress Note Patient Name: Jeffrey Humphrey DOB: 10-09-1956 MRN: [id number]   Date of Service  07/14/2014  HPI/Events of Note  K low Na 125  eICU Interventions  Resume 3% nacl, K supp     Intervention Category Major Interventions: Electrolyte abnormality - evaluation and management  Shan Levans 07/14/2014, 5:15 AM

## 2014-07-14 NOTE — Progress Notes (Signed)
PULMONARY / CRITICAL CARE MEDICINE   Name: Jeffrey Humphrey MRN: [id number] DOB: Jul 05, 1956    ADMISSION DATE:  07/11/2014 CONSULTATION DATE:  07/12/2014  REFERRING MD :  EDP  CHIEF COMPLAINT: seizure  INITIAL PRESENTATION: 58 year old male from home with seizure like activity. Required large amounts of benzo via EMS. In ED was obtunded with questionable airway protection. Reportedly drinks 10 beers per day. Hyponatremic 113 in ED. CVL placed and 3% started. PCCM asked to see for admission.   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  9/18 eeg>>>neg focus, enceph noted  SIGNIFICANT EVENTS: 9/18 admitted for seizures.    SUBJECTIVE: tolerated CPAP overnight Afebrile Good UO Na as high as 129 , then dropped again, 3% restarted  VITAL SIGNS: Temp:  [97.8 F (36.6 C)-98.9 F (37.2 C)] 98.5 F (36.9 C) (09/20 0734) Pulse Rate:  [64-161] 87 (09/20 0800) Resp:  [16-29] 19 (09/20 0800) BP: (125-191)/(54-118) 154/81 mmHg (09/20 0800) SpO2:  [88 %-100 %] 93 % (09/20 0823) FiO2 (%):  [40 %] 40 % (09/19 1006) Weight:  [113 kg (249 lb 1.9 oz)] 113 kg (249 lb 1.9 oz) (09/20 0416) HEMODYNAMICS:   VENTILATOR SETTINGS: Vent Mode:  [-] PSV;CPAP FiO2 (%):  [40 %] 40 % PEEP:  [5 cmH20] 5 cmH20 Pressure Support:  [5 cmH20-10 cmH20] 5 cmH20 INTAKE / OUTPUT:  Intake/Output Summary (Last 24 hours) at 07/14/14 7829 Last data filed at 07/14/14 0800  Gross per 24 hour  Intake 1569.37 ml  Output   1640 ml  Net -70.63 ml    PHYSICAL EXAMINATION: General:  Obese male in bed, rass -1 to +2 Neuro:  Moved all ext, perrl 2 mm HEENT:  [place], C-collar in place. No JVD noted Cardiovascular:  Borderline tachy, regular, no MRG Lungs:  coarse Abdomen:  Obese, non-tender, non-distended.  Musculoskeletal:  No acute deformity Skin:  Intact  LABS:  CBC  Recent Labs Lab 07/12/14 0800 07/13/14 0458 07/14/14 0400  WBC 12.5* 7.4 8.6  HGB 12.8*  12.2* 11.2*  HCT 34.3* 33.8* 31.5*  PLT 249 210 225   Coag's  Recent Labs Lab 07/12/14 0800  INR 1.07   BMET  Recent Labs Lab 07/13/14 1532 07/13/14 2150 07/14/14 0400  NA 129* 127* 125*  K 3.6* 3.5* 3.4*  CL 91* 89* 87*  CO2 BUN CREATININE 0.69 0.69 0.65  GLUCOSE 113* 161* 103*   Electrolytes  Recent Labs Lab 07/12/14 0345  07/12/14 1444  07/13/14 0156  07/13/14 1532 07/13/14 2150 07/14/14 0400  CALCIUM 8.1*  < >  --   < >  --   < > 9.0 9.0 8.8  MG 1.9  --  2.2  --  1.9  --   --   --   --   PHOS 2.7  --  1.4*  --  1.8*  --   --   --   --   < > = values in this interval not displayed. Sepsis Markers  Recent Labs Lab 07/11/14 2334 07/12/14 0205 07/12/14 0800 07/12/14 1132 07/12/14 1732  LATICACIDVEN 2.30*  --  2.0 1.0 1.1  PROCALCITON  --  0.12  --   --   --    ABG  Recent Labs Lab 07/12/14 0449 07/12/14 0610  PHART 7.243* 7.392  PCO2ART 68.1* 41.1  PO2ART 91.0 178.0*   Liver Enzymes  Recent Labs Lab 07/11/14 2326  07/12/14 0800  AST 114* 111*  ALT 130* 121*  ALKPHOS 90 88  BILITOT 0.7 0.7  ALBUMIN 3.4* 3.3*   Cardiac Enzymes  Recent Labs Lab 07/12/14 0800  PROBNP 367.2*   Glucose  Recent Labs Lab 07/13/14 1224 07/13/14 1555 07/13/14 2004 07/13/14 2357 07/14/14 0404 07/14/14 0710  GLUCAP 132* 111* 107* 97 110* 142*    Imaging No results found.   ASSESSMENT / PLAN:  PULMONARY A:  Acute hypercarbic respiratory failure in setting of encephalopathy -resolved CAP OSA, likley obesity hypoventilation syndrome  P:  CPAP q hs & during sleep   CARDIOVASCULAR RIJ CVL 9/18 >>> A:  H/o hypertension Echo - EF 55% P:  Resume home anti-hypertensives   RENAL A:   Hyponatremia , chronic - ? Beer potomania ,Has some chronicity to Na 125 range Hypocalcemia hypoK P:   Ct  Hypertonic 3% saline -can dc once 130 range bmet at q6h supp k    GASTROINTESTINAL A:   H/o GERD  P:   IV  PPI   HEMATOLOGIC A:   Oral bleeding, tongue injury s/p fall.  Leukocytosis dvt prev   P:  SCD sub q hep   INFECTIOUS A:   CAP- ? Aspiration  P:   BCx2 9/18 >>>ng Sputum 9/18 >>>ng strep neg, leg urine neg   Abx: unasyn, start date 9/18>>>9/18 Ceftriaxone 9/18>>> azithr 9/18>>>   NEUROLOGIC A:   Seizure, eeg neg focus Acute encehalopathy, multifactorial - hyponatremia, OD, DTs?  P:   RASS goal: 0 Thiamine Folic acid High risk withdrawal -ativan prn   TODAY'S SUMMARY: Tolerated extubation & CPAP overnight, slow na correction, -high 120's acceptable since chronic, w/f withdrawal OK to transfer to SDU for Na monitoring   I have personally obtained a history, examined the patient, evaluated laboratory and imaging results, formulated the assessment and plan and placed orders. CRITICAL CARE: The patient is critically ill with multiple organ systems failure and requires high complexity decision making for assessment and support, frequent evaluation and titration of therapies, application of advanced monitoring technologies and extensive interpretation of multiple databases. Critical Care Time devoted to patient care services described in this note is 31  minutes.    Cyril Mourning MD. Tonny Bollman. [facility] Pager [phone] If no response call [phone]

## 2014-07-15 DIAGNOSIS — R569 Unspecified convulsions: Secondary | ICD-10-CM

## 2014-07-15 LAB — COMPREHENSIVE METABOLIC PANEL
ALT: 59 U/L — AB (ref 0–53)
ANION GAP: 13 (ref 5–15)
AST: 37 U/L (ref 0–37)
Albumin: 3.2 g/dL — ABNORMAL LOW (ref 3.5–5.2)
Alkaline Phosphatase: 106 U/L (ref 39–117)
BUN: 9 mg/dL (ref 6–23)
CALCIUM: 9.2 mg/dL (ref 8.4–10.5)
CO2: 27 mEq/L (ref 19–32)
CREATININE: 0.81 mg/dL (ref 0.50–1.35)
Chloride: 85 mEq/L — ABNORMAL LOW (ref 96–112)
GFR calc Af Amer: >90 mL/min (ref >90)
GLUCOSE: 106 mg/dL — AB (ref 70–99)
Potassium: 3.5 mEq/L — ABNORMAL LOW (ref 3.7–5.3)
SODIUM: 125 meq/L — AB (ref 137–147)
TOTAL PROTEIN: 6.7 g/dL (ref 6.0–8.3)
Total Bilirubin: 0.6 mg/dL (ref 0.3–1.2)

## 2014-07-15 LAB — BASIC METABOLIC PANEL
Anion gap: 12 (ref 5–15)
Anion gap: 13 (ref 5–15)
BUN: 7 mg/dL (ref 6–23)
BUN: 6 mg/dL (ref 6–23)
CALCIUM: 8.9 mg/dL (ref 8.4–10.5)
CHLORIDE: 86 meq/L — AB (ref 96–112)
CO2: 28 mEq/L (ref 19–32)
CO2: 28 mEq/L (ref 19–32)
CREATININE: 0.7 mg/dL (ref 0.50–1.35)
CREATININE: 0.72 mg/dL (ref 0.50–1.35)
Calcium: 9 mg/dL (ref 8.4–10.5)
Chloride: 89 mEq/L — ABNORMAL LOW (ref 96–112)
GFR calc Af Amer: >90 mL/min (ref >90)
GFR calc non Af Amer: >90 mL/min (ref >90)
GFR calc non Af Amer: >90 mL/min (ref >90)
Glucose, Bld: 118 mg/dL — ABNORMAL HIGH (ref 70–99)
Glucose, Bld: 106 mg/dL — ABNORMAL HIGH (ref 70–99)
Potassium: 3.6 mEq/L — ABNORMAL LOW (ref 3.7–5.3)
Potassium: 3.4 mEq/L — ABNORMAL LOW (ref 3.7–5.3)
Sodium: 129 mEq/L — ABNORMAL LOW (ref 137–147)
Sodium: 127 mEq/L — ABNORMAL LOW (ref 137–147)

## 2014-07-15 LAB — CBC
HCT: 31.8 % — ABNORMAL LOW (ref 39.0–52.0)
Hemoglobin: 11.3 g/dL — ABNORMAL LOW (ref 13.0–17.0)
MCH: 33.7 pg (ref 26.0–34.0)
MCHC: 35.5 g/dL (ref 30.0–36.0)
MCV: 94.9 fL (ref 78.0–100.0)
Platelets: 228 10*3/uL (ref 150–400)
RBC: 3.35 MIL/uL — ABNORMAL LOW (ref 4.22–5.81)
RDW: 12.7 % (ref 11.5–15.5)
WBC: 8.9 10*3/uL (ref 4.0–10.5)

## 2014-07-15 LAB — TROPONIN I
Troponin I: <0.3 ng/mL (ref <0.30)
Troponin I: <0.3 ng/mL (ref <0.30)
Troponin I: <0.3 ng/mL (ref <0.30)

## 2014-07-15 LAB — TSH: TSH: 2.75 u[IU]/mL (ref 0.350–4.500)

## 2014-07-15 LAB — MAGNESIUM: Magnesium: 1.5 mg/dL (ref 1.5–2.5)

## 2014-07-15 LAB — PHOSPHORUS: PHOSPHORUS: 3.7 mg/dL (ref 2.3–4.6)

## 2014-07-15 MED ORDER — LORAZEPAM 1 MG PO TABS
1.0000 mg | ORAL_TABLET | ORAL | Status: DC | PRN
  Administered 2014-07-17: 2 mg via ORAL
  Administered 2014-07-18: 1 mg via ORAL
  Filled 2014-07-15: qty 2
  Filled 2014-07-15: qty 1

## 2014-07-15 MED ORDER — NEBIVOLOL HCL 10 MG PO TABS
10.0000 mg | ORAL_TABLET | Freq: Every day | ORAL | Status: DC
  Administered 2014-07-15 – 2014-07-18 (×4): 10 mg via ORAL
  Filled 2014-07-15 (×4): qty 1

## 2014-07-15 MED ORDER — PAROXETINE HCL 20 MG PO TABS
20.0000 mg | ORAL_TABLET | Freq: Every day | ORAL | Status: DC
  Administered 2014-07-15 – 2014-07-18 (×4): 20 mg via ORAL
  Filled 2014-07-15 (×4): qty 1

## 2014-07-15 MED ORDER — LORAZEPAM 2 MG/ML IJ SOLN: 1.0000 mg | INTRAMUSCULAR | Status: DC | PRN

## 2014-07-15 MED ORDER — LEVALBUTEROL HCL 0.63 MG/3ML IN NEBU: 0.6300 mg | INHALATION_SOLUTION | RESPIRATORY_TRACT | Status: DC | PRN

## 2014-07-15 MED ORDER — AZITHROMYCIN 500 MG PO TABS
500.0000 mg | ORAL_TABLET | Freq: Every day | ORAL | Status: DC
  Administered 2014-07-16 – 2014-07-17 (×2): 500 mg via ORAL
  Filled 2014-07-15 (×2): qty 1

## 2014-07-15 MED ORDER — FOLIC ACID 1 MG PO TABS
1.0000 mg | ORAL_TABLET | Freq: Every day | ORAL | Status: DC
  Administered 2014-07-16 – 2014-07-18 (×3): 1 mg via ORAL
  Filled 2014-07-15 (×3): qty 1

## 2014-07-15 MED ORDER — SODIUM CHLORIDE 0.9 % IV SOLN
INTRAVENOUS | Status: DC
  Administered 2014-07-15 – 2014-07-16 (×2): via INTRAVENOUS

## 2014-07-15 NOTE — Evaluation (Signed)
Physical Therapy Evaluation Patient Details Name: Jeffrey Humphrey MRN: [id number] DOB: November 01, 1955 Today's Date: 07/15/2014   History of Present Illness  Pt is a 58 y.o. male presenting with hyponatremia and is s/p seizing with fall and possible head trauma on 07/11/14. Hx of HTN, anxiety/depression, peripheral edema, ETOH abuse and seizure activity  Clinical Impression  PTA, pt was independent with all functional mobility and ADLs. Pt currently requires min-mod A for transfers and ambulation 2/2 impaired coordination and unsteadiness. Pt would benefit from continued skilled PT to address deficits listed below and progress towards PTA status. Currently recommending 24 hr supervision/assistance with home health at d/c.    Follow Up Recommendations Home health PT    Equipment Recommendations  Other (comment) (tbd)    Recommendations for Other Services       Precautions / Restrictions Precautions Precautions: Fall      Mobility  Bed Mobility Overal bed mobility: Needs Assistance Bed Mobility: Supine to Sit     Supine to sit: Min guard (HOB elevated)     General bed mobility comments: Pt require minimal cueing for sitting up at EOB.   Transfers Overall transfer level: Needs assistance Equipment used: 1 person hand held assist Transfers: Sit to/from Stand Sit to Stand: Min assist (Cue to push up from bed)         General transfer comment: Min A provided for stability.  With initial sitting EOB, pt instructed in pursed lip breathing to reduce feelings of SOB.  Ambulation/Gait Ambulation/Gait assistance: Min assist;Mod assist Ambulation Distance (Feet): 15 Feet Assistive device: 1 person hand held assist Gait Pattern/deviations: Step-through pattern;Decreased stride length;Ataxic Gait velocity: slow Gait velocity interpretation: Below normal speed for age/gender General Gait Details: Pt requires A 2/2 instability and impaired coordination during ambulation. Instability  increases during turns, requires more A to prevent LOB. While ambulating, pt attempted to "tap dance," mod A required to prevent LOB.   Stairs            Wheelchair Mobility    Modified Rankin (Stroke Patients Only)       Balance Overall balance assessment: Needs assistance Sitting-balance support: Feet supported Sitting balance-Leahy Scale: Good Sitting balance - Comments: Pt able to maintain balance with min A to prevent falling backwards during seated MMT.   Standing balance support: During functional activity;Single extremity supported Standing balance-Leahy Scale: Fair                               Pertinent Vitals/Pain Pain Assessment: No/denies pain    Home Living Family/patient expects to be discharged to:: Private residence Living Arrangements: Spouse/significant other Available Help at Discharge: Family;Available PRN/intermittently Type of Home: House Home Access: Level entry     Home Layout: One level (2 step to addtional room) Home Equipment: None      Prior Function Level of Independence: Independent               Hand Dominance        Extremity/Trunk Assessment                 RLE Deficits / Details: Reduced coordination during ambulation LLE Deficits / Details: impaired coordination during ambulation. reduced hip flexor strength compared to R (L 4/5; R 5/5).   Cervical / Trunk Assessment: Normal  Communication   Communication: No difficulties  Cognition Arousal/Alertness: Awake/alert Behavior During Therapy: WFL for tasks assessed/performed;Restless Overall Cognitive Status: Impaired/Different from baseline Area of  Impairment: Safety/judgement;Memory;Following commands;Awareness;Attention   Current Attention Level: Selective Memory: Decreased short-term memory Following Commands: Follows one step commands consistently Safety/Judgement: Decreased awareness of deficits;Decreased awareness of safety Awareness:  Intellectual   General Comments: When asked why he was in the hospital, pt reports that he "must have done soemthing stupid." Pt is easily distracted and gets off track easily. Pt does not seem aware of his deficits, tried to demonstrate tap dancing during ambulation.    General Comments General comments (skin integrity, edema, etc.): Pt demonstrates reduced awareness of deficits, especially those relating to coordination. Discussed with pt in-room safety and d/c plan. Pt is eager to go home, but recommended that he have 24hr assistance/supervision initially.   Pt very concerned about when he will be able to return to work, recommended that he discuss that with MD. O2 via [place] not used during treatment. Pt instructed in pursed lip breathing.     Exercises        Assessment/Plan    PT Assessment Patient needs continued PT services  PT Diagnosis Difficulty walking;Abnormality of gait   PT Problem List Decreased strength;Decreased activity tolerance;Decreased balance;Decreased mobility;Decreased coordination;Decreased safety awareness  PT Treatment Interventions Gait training;Functional mobility training;Therapeutic activities;Therapeutic exercise;Balance training;Neuromuscular re-education;Patient/family education   PT Goals (Current goals can be found in the Care Plan section) Acute Rehab PT Goals Patient Stated Goal: to go home PT Goal Formulation: With patient Time For Goal Achievement: 07/29/14 Potential to Achieve Goals: Good    Frequency Min 3X/week   Barriers to discharge        Co-evaluation               End of Session Equipment Utilized During Treatment: Gait belt Activity Tolerance: Patient tolerated treatment well Patient left: in chair;with call bell/phone within reach Nurse Communication: Mobility status (O2 stat during treatment)         Time: 9604-5409 PT Time Calculation (min): 22 min   Charges:         PT G Codes:           [place] 07/15/2014, 3:46 PM [name], SPT

## 2014-07-15 NOTE — Evaluation (Signed)
Reviewed and agree with assessment and POC.  Pretty Weltman, PT DPT  [phone]  

## 2014-07-15 NOTE — Progress Notes (Signed)
Utilization review completed.  

## 2014-07-15 NOTE — Progress Notes (Signed)
Pt hr converted back to NSR approximately 15 minutes after initaiting of Cardizem gtt VSS will continue to monitor @ 2338

## 2014-07-15 NOTE — Progress Notes (Signed)
PULMONARY / CRITICAL CARE MEDICINE   Name: Jeffrey Humphrey MRN: [id number] DOB: 1956-03-19    ADMISSION DATE:  07/11/2014 CONSULTATION DATE:  07/12/2014  REFERRING MD :  EDP  CHIEF COMPLAINT: seizure  INITIAL PRESENTATION:  58 yo male former smoker with hx of alcohol abuse presented to ED with seizure from hyponatremia (Na 114 on admission).  PCCM asked to admit to ICU.  Baseline Na is 125 to 130.  STUDIES:  9/18 CT head >> No acute process 9/18 CT Cspine >> DJD, no acute traumatic findings.  9/18 eeg >> neg focus, enceph noted 9/18 Echo EF 55 to 60%  SIGNIFICANT EVENTS: 9/18 admitted for seizures, start 3% NS, intubated for airway protection 9/20 A fib with RVR >> added cardizem gtt 9/21 d/c 3% NS  SUBJECTIVE:  Developed A fib with RVR overnight.  Denies chest pain, dyspnea, abdominal pain.  VITAL SIGNS: Temp:  [98.2 F (36.8 C)-101.6 F (38.7 C)] 98.3 F (36.8 C) (09/21 0800) Pulse Rate:  [77-121] 77 (09/21 0800) Resp:  [16-38] 19 (09/21 0800) BP: (137-178)/(76-118) 138/85 mmHg (09/21 0800) SpO2:  [89 %-99 %] 97 % (09/21 0800) Weight:  [246 lb 7.6 oz (111.8 kg)] 246 lb 7.6 oz (111.8 kg) (09/21 0500) INTAKE / OUTPUT:  Intake/Output Summary (Last 24 hours) at 07/15/14 1014 Last data filed at 07/15/14 0615  Gross per 24 hour  Intake 1782.08 ml  Output    650 ml  Net 1132.08 ml    PHYSICAL EXAMINATION: General: no distress Neuro: alert, normal strength, CN intact HEENT: pupils equal/reactive Cardiovascular: irregular, tachycardic Lungs: no wheeze Abdomen:  Obese, non-tender Musculoskeletal: no tremor, no edema Skin: no rashes  LABS:  CBC  Recent Labs Lab 07/13/14 0458 07/14/14 0400 07/15/14 0334  WBC 7.4 8.6 8.9  HGB 12.2* 11.2* 11.3*  HCT 33.8* 31.5* 31.8*  PLT 210 225 228   Coag's  Recent Labs Lab 07/12/14 0800  INR 1.07   BMET  Recent Labs Lab 07/14/14 1500 07/14/14 2151 07/15/14 0333  NA 129* 128* 129*  K 3.7 3.4* 3.6*  CL 89*  88* 89*  CO2 BUN CREATININE 0.73 0.76 0.70  GLUCOSE 144* 110* 118*   Electrolytes  Recent Labs Lab 07/12/14 0345  07/12/14 1444  07/13/14 0156  07/14/14 1500 07/14/14 2151 07/15/14 0333  CALCIUM 8.1*  < >  --   < >  --   < > 9.0 8.9 8.9  MG 1.9  --  2.2  --  1.9  --   --   --   --   PHOS 2.7  --  1.4*  --  1.8*  --   --   --   --   < > = values in this interval not displayed.  Sepsis Markers  Recent Labs Lab 07/11/14 2334 07/12/14 0205 07/12/14 0800 07/12/14 1132 07/12/14 1732  LATICACIDVEN 2.30*  --  2.0 1.0 1.1  PROCALCITON  --  0.12  --   --   --    ABG  Recent Labs Lab 07/12/14 0449 07/12/14 0610  PHART 7.243* 7.392  PCO2ART 68.1* 41.1  PO2ART 91.0 178.0*   Liver Enzymes  Recent Labs Lab 07/11/14 2326 07/12/14 0800  AST 114* 111*  ALT 130* 121*  ALKPHOS 90 88  BILITOT 0.7 0.7  ALBUMIN 3.4* 3.3*   Cardiac Enzymes  Recent Labs Lab 07/12/14 0800  PROBNP 367.2*   Glucose  Recent Labs Lab 07/13/14 1224 07/13/14 1555 07/13/14  2004 07/13/14 2357 07/14/14 0404 07/14/14 0710  GLUCAP 132* 111* 107* 97 110* 142*    Imaging No results found.   ASSESSMENT / PLAN:  PULMONARY A:  Acute hypoxic/hypercapnic respiratory failure 2nd to seizure, hyponatremia, inability to protect airway and CAP. Likely OSA/OHS. Hx of tobacco abuse ?obstructive lung disease. P:  Bronchial hygiene F/u CXR intermittently Oxygen to keep SpO2 > 92% Empiric CPAP qhs >> will need further assessment as outpt PRN xopenex  Will need PFT's as outpt  CARDIOVASCULAR RIJ CVL 9/18 >>> A:  Hx of HTN. New onset A fib with RVR 9/20. P:  Resume bystolic Wean off cardizem to keep HR < 110 F/u cardiac enzymes  RENAL A:   Hyponatremia 2nd to beer potomania >> baseline Na 125 to 130. Hypokalemia. P:   D/c 3% NS 9/21 >> change to NS F/u BMET Monitor and replace electrolytes as needed Check TSH, cortisol  GASTROINTESTINAL A:   H/o GERD. P:    Protonix  HEMATOLOGIC A:   Mild anemia. P:  F/u CBC intermittently SQ heparin for DVT prevention  INFECTIOUS A:   CAP- ? Aspiration P:   Day 4 of rocephin, zithromax  Blood cx 9/18 >>   NEUROLOGIC A:   Acute encephalopathy with seizure likely from hyponatremia. Hx of ETOH abuse. Hx of depression. Deconditioning. P:   Paxil CIWA with prn Ativan Thiamine, folic acid F/u with PT when more stable  Updated family at bedside.  Will ask [facility] to assume care from 9/22, and PCCM sign off.  Coralyn Helling, MD [facility] Pulmonary/Critical Care 07/15/2014, 10:34 AM Pager:  [phone] After 3pm call: [phone]

## 2014-07-15 NOTE — Progress Notes (Signed)
Gene Glazebrook, PT DPT  [phone]  

## 2014-07-15 NOTE — Progress Notes (Signed)
Pt went from SR/ST to AFib with RVR at approximatly 2236  Hr 130's to 150's paged otherwise VSS.  Dr. Delford Field who ordered Cardizem gtt, strip in chart will continue to monitor

## 2014-07-15 NOTE — Progress Notes (Signed)
PT Cancellation Note  Patient Details Name: Jeffrey Humphrey MRN: [id number] DOB: [birth year]   Cancelled Treatment:    Reason Eval/Treat Not Completed: Medical issues which prohibited therapy (Pt HR ranging 113-152bpm at rest )  [name] 07/15/2014, 10:39 AM [name], SPT

## 2014-07-16 DIAGNOSIS — I4891 Unspecified atrial fibrillation: Secondary | ICD-10-CM

## 2014-07-16 DIAGNOSIS — F1010 Alcohol abuse, uncomplicated: Secondary | ICD-10-CM

## 2014-07-16 DIAGNOSIS — G4733 Obstructive sleep apnea (adult) (pediatric): Secondary | ICD-10-CM

## 2014-07-16 DIAGNOSIS — I10 Essential (primary) hypertension: Secondary | ICD-10-CM

## 2014-07-16 LAB — BASIC METABOLIC PANEL
Anion gap: 11 (ref 5–15)
BUN: 11 mg/dL (ref 6–23)
CHLORIDE: 86 meq/L — AB (ref 96–112)
CO2: 29 mEq/L (ref 19–32)
CREATININE: 0.77 mg/dL (ref 0.50–1.35)
Calcium: 9.2 mg/dL (ref 8.4–10.5)
GFR calc Af Amer: >90 mL/min (ref >90)
GFR calc non Af Amer: >90 mL/min (ref >90)
GLUCOSE: 114 mg/dL — AB (ref 70–99)
POTASSIUM: 3.6 meq/L — AB (ref 3.7–5.3)
Sodium: 126 mEq/L — ABNORMAL LOW (ref 137–147)

## 2014-07-16 LAB — CBC
HCT: 32 % — ABNORMAL LOW (ref 39.0–52.0)
HEMOGLOBIN: 11.2 g/dL — AB (ref 13.0–17.0)
MCH: 33.2 pg (ref 26.0–34.0)
MCHC: 35 g/dL (ref 30.0–36.0)
MCV: 95 fL (ref 78.0–100.0)
Platelets: 229 10*3/uL (ref 150–400)
RBC: 3.37 MIL/uL — AB (ref 4.22–5.81)
RDW: 12.8 % (ref 11.5–15.5)
WBC: 8.5 10*3/uL (ref 4.0–10.5)

## 2014-07-16 LAB — CORTISOL: Cortisol, Plasma: 15 ug/dL

## 2014-07-16 MED ORDER — LEVALBUTEROL HCL 0.63 MG/3ML IN NEBU
0.6300 mg | INHALATION_SOLUTION | Freq: Three times a day (TID) | RESPIRATORY_TRACT | Status: DC
  Administered 2014-07-16 – 2014-07-17 (×2): 0.63 mg via RESPIRATORY_TRACT
  Filled 2014-07-16 (×4): qty 3

## 2014-07-16 MED ORDER — DILTIAZEM HCL 30 MG PO TABS
30.0000 mg | ORAL_TABLET | Freq: Two times a day (BID) | ORAL | Status: DC
  Administered 2014-07-16 – 2014-07-17 (×2): 30 mg via ORAL
  Filled 2014-07-16 (×3): qty 1

## 2014-07-16 MED ORDER — METHYLPREDNISOLONE SODIUM SUCC 125 MG IJ SOLR
60.0000 mg | INTRAMUSCULAR | Status: DC
  Administered 2014-07-16: 60 mg via INTRAVENOUS
  Filled 2014-07-16 (×2): qty 0.96

## 2014-07-16 MED ORDER — DM-GUAIFENESIN ER 30-600 MG PO TB12
1.0000 | ORAL_TABLET | Freq: Two times a day (BID) | ORAL | Status: DC
  Administered 2014-07-16 – 2014-07-18 (×4): 1 via ORAL
  Filled 2014-07-16 (×5): qty 1

## 2014-07-16 MED ORDER — LEVALBUTEROL HCL 0.63 MG/3ML IN NEBU: 0.6300 mg | INHALATION_SOLUTION | Freq: Three times a day (TID) | RESPIRATORY_TRACT | Status: DC

## 2014-07-16 NOTE — Progress Notes (Signed)
Pt has refused use of cpap and denies having a sleep study done.  Pt is off o2 and has said the cpap is uncomfortable and he's unable to rest with it on.

## 2014-07-16 NOTE — Progress Notes (Signed)
Pt converted to Afib w/ RVR, 130-150s at 0417, notified by centralized monitoring. Restarted Cardizem drip at 5 mg/hr. Vital signs stable will continue to monitor.

## 2014-07-16 NOTE — Progress Notes (Signed)
[facility] TEAM 1 - Stepdown/ICU TEAM Progress Note  Jeffrey Humphrey ZOX:[id number] DOB: 1955-12-30 DOA: 07/11/2014 PCP: No PCP Per Patient  Admit HPI / Brief Narrative: 58 yo WM PMHx  male former smoker with hx of alcohol abuse presented to ED with seizure from hyponatremia (Na 114 on admission). PCCM asked to admit to ICU. Baseline Na is 125 to 130.   HPI/Subjective: 9/22 A/O. x4, states negative history of previous seizure. States drinks 3-4 beers per night  Assessment/Plan:  Acute hypoxic/hypercapnic respiratory failure 2nd to seizure -Resolved  Acute encephalopathy with seizure likely from hyponatremia -Resolving   Hyponatremia 2nd to beer potomania >> baseline Na 125 to 130.  -Patient back to baseline  Hypokalemia -Resolved monitor closely.  -TSH within normal limit -Cortisol within normal limit  CAP/Aspiration ? -Continue rocephin, zithromax  -Continue Xopenex BID -Mucinex DM BID -Flutter valve -Solu-Medrol 60 mg daily   OSA/OHS.  -CPAP -Oxygen to keep SpO2 > 92%  -Will need PFT and sleep study as outpatient  Hx of tobacco abuse ?obstructive lung disease.  -See OSA/OHS   HTN.  -Within AHA guidelines -See A. fib with RVR  New onset A fib with RVR 9/20.  -Currently in NSR -Continue Bystolic 10 mg daily -DC Cardizem drip and start Cardizem 30 mg BID; maintain HR < 110  -Troponin x3 negative  -May not require anticoagulation if patient remains in NSR, otherwise would start on Eliquis  Seizure activity -seizure secondary to hypernatremia resolved -EEG negative for seizure activity -Counseled patient that he cannot drive until cleared by neurologist -PT/OT consultation in a.m.   H/o GERD.  -Continue protonix  Mild anemia.  -F/u CBC intermittently    Hx of ETOH abuse.  -Counseled patient and brother at length on need for abstinence -CIWA with prn Ativan  -Thiamine, folic acid   Hx of depression.   Continue Paxil  F/u with PT when more  stable   Code Status: FULL Family Communication: no family present at time of exam Disposition Plan: Per neurology   Consultants: -Dr. Max Fickle [facility] M.) -Dr. Noel Christmas (neurology)   Procedure/Significant Events: 9/18 CT head >> No acute process  9/18 CT Cspine >> DJD, no acute traumatic findings.  9/18 EEG >> neg focus, enceph noted  9/18 Echo EF 55 to 60% 9/18 admitted for seizures, start 3% NS, intubated for airway protection  9/20 A fib with RVR >> added cardizem gtt  9/21 d/c 3% NS    Culture 9/18 MRSA by PCR negative 9/18 urine negative 9/18 respiratory culture negative 9/18 blood right/left hand NGTD    Antibiotics: Azithromycin 9/22>> Ceftriaxone 9/18>>   DVT prophylaxis: Heparin subcutaneous   Devices NA   LINES / TUBES:  9/18 RIJ triple-lumen CVC    Continuous Infusions: . sodium chloride 40 mL/hr at 07/16/14 1000  . diltiazem (CARDIZEM) infusion Stopped (07/16/14 1200)    Objective: VITAL SIGNS: Temp: 98.1 F (36.7 C) (09/22 1100) Temp src: Oral (09/22 1100) BP: 128/71 mmHg (09/22 1000) Pulse Rate: 78 (09/22 1000) SPO2; 97% on room air FIO2:   Intake/Output Summary (Last 24 hours) at 07/16/14 1552 Last data filed at 07/16/14 1000  Gross per 24 hour  Intake 1066.83 ml  Output    350 ml  Net 716.83 ml     Exam: General: A./O. x4, NAD, No acute respiratory distress Lungs: Clear to auscultation bilaterally without wheezes or crackles Cardiovascular: Regular rate and rhythm without murmur gallop or rub normal S1 and S2 Abdomen: Nontender, nondistended,  soft, bowel sounds positive, no rebound, no ascites, no appreciable mass Extremities: No significant cyanosis, clubbing, or edema bilateral lower extremities Neurologic; cranial nerves II through XII intact, tongue/uvula midline, extremity strength 5/5, sensation intact, bilateral asterixis  Data Reviewed: Basic Metabolic Panel:  Recent Labs Lab 07/12/14 0211  07/12/14 0345  07/12/14 1444  07/13/14 0156  07/14/14 2151 07/15/14 0333 07/15/14 0930 07/15/14 1700 07/16/14 0311  NA 113* 117*  < >  --   < >  --   < > 128* 129* 127* 125* 126*  K 4.1 3.4*  < >  --   < >  --   < > 3.4* 3.6* 3.4* 3.5* 3.6*  CL 77* 77*  < >  --   < >  --   < > 88* 89* 86* 85* 86*  CO2  --  27  < >  --   < >  --   < > GLUCOSE 109* 117*  < >  --   < >  --   < > 110* 118* 106* 106* 114*  BUN 15 11  < >  --   < >  --   < > CREATININE 0.70 0.66  < >  --   < >  --   < > 0.76 0.70 0.72 0.81 0.77  CALCIUM  --  8.1*  < >  --   < >  --   < > 8.9 8.9 9.0 9.2 9.2  MG  --  1.9  --  2.2  --  1.9  --   --   --   --  1.5  --   PHOS  --  2.7  --  1.4*  --  1.8*  --   --   --   --  3.7  --   < > = values in this interval not displayed. Liver Function Tests:  Recent Labs Lab 07/11/14 2326 07/12/14 0800 07/15/14 1700  AST 114* 111* 37  ALT 130* 121* 59*  ALKPHOS 90 88 106  BILITOT 0.7 0.7 0.6  PROT 6.4 6.3 6.7  ALBUMIN 3.4* 3.3* 3.2*    Recent Labs Lab 07/11/14 2326  LIPASE 276*   No results found for this basename: AMMONIA,  in the last 168 hours CBC:  Recent Labs Lab 07/11/14 2326  07/12/14 0800 07/13/14 0458 07/14/14 0400 07/15/14 0334 07/16/14 0311  WBC 15.6*  < > 12.5* 7.4 8.6 8.9 8.5  NEUTROABS 14.2*  --   --   --   --   --   --   HGB 13.2  < > 12.8* 12.2* 11.2* 11.3* 11.2*  HCT 34.6*  < > 34.3* 33.8* 31.5* 31.8* 32.0*  MCV 89.2  < > 92.5 93.4 94.9 94.9 95.0  PLT 276  < > 249 210 225 228 229  < > = values in this interval not displayed. Cardiac Enzymes:  Recent Labs Lab 07/11/14 2326 07/15/14 0930 07/15/14 1630 07/15/14 2256  CKTOTAL 415*  --   --   --   TROPONINI  --  <0.30 <0.30 <0.30   BNP (last 3 results)  Recent Labs  07/12/14 0800  PROBNP 367.2*   CBG:  Recent Labs Lab 07/13/14 1555 07/13/14 2004 07/13/14 2357 07/14/14 0404 07/14/14 0710  GLUCAP 111* 107* 97 110* 142*    Recent Results (from  the past 240 hour(s))  MRSA PCR SCREENING  Status: None   Collection Time    07/12/14  2:56 AM      Result Value Ref Range Status   MRSA by PCR NEGATIVE  NEGATIVE Final   Comment:            The GeneXpert MRSA Assay (FDA     approved for NASAL specimens     only), is one component of a     comprehensive MRSA colonization     surveillance program. It is not     intended to diagnose MRSA     infection nor to guide or     monitor treatment for     MRSA infections.  URINE CULTURE     Status: None   Collection Time    07/12/14  3:13 AM      Result Value Ref Range Status   Specimen Description URINE, CATHETERIZED   Final   Special Requests NONE   Final   Culture  Setup Time     Final   Value: 07/12/2014 09:51     Performed at [facility] Count     Final   Value: NO GROWTH     Performed at [facility]   Culture     Final   Value: NO GROWTH     Performed at [facility]   Report Status 07/13/2014 FINAL   Final  CULTURE, RESPIRATORY (NON-EXPECTORATED)     Status: None   Collection Time    07/12/14  5:45 AM      Result Value Ref Range Status   Specimen Description TRACHEAL ASPIRATE   Final   Special Requests NONE   Final   Gram Stain     Final   Value: FEW WBC PRESENT,BOTH PMN AND MONONUCLEAR     RARE SQUAMOUS EPITHELIAL CELLS PRESENT     MODERATE GRAM POSITIVE COCCI     IN PAIRS IN CLUSTERS FEW GRAM NEGATIVE RODS     Performed at [facility]   Culture     Final   Value: NORMAL OROPHARYNGEAL FLORA     Performed at [facility]   Report Status 07/14/2014 FINAL   Final  CULTURE, BLOOD (ROUTINE X 2)     Status: None   Collection Time    07/12/14  7:50 AM      Result Value Ref Range Status   Specimen Description BLOOD RIGHT HAND   Final   Special Requests BOTTLES DRAWN AEROBIC ONLY 5CC   Final   Culture  Setup Time     Final   Value: 07/12/2014 14:34     Performed at [facility]   Culture     Final   Value:         BLOOD CULTURE RECEIVED NO GROWTH TO DATE CULTURE WILL BE HELD FOR 5 DAYS BEFORE ISSUING A FINAL NEGATIVE REPORT     Performed at [facility]   Report Status PENDING   Incomplete  CULTURE, BLOOD (ROUTINE X 2)     Status: None   Collection Time    07/12/14  8:00 AM      Result Value Ref Range Status   Specimen Description BLOOD LEFT HAND   Final   Special Requests BOTTLES DRAWN AEROBIC ONLY [facility]   Final   Culture  Setup Time     Final   Value: 07/12/2014 14:34     Performed at [facility]  Final   Value:        BLOOD CULTURE RECEIVED NO GROWTH TO DATE CULTURE WILL BE HELD FOR 5 DAYS BEFORE ISSUING A FINAL NEGATIVE REPORT     Performed at [facility]   Report Status PENDING   Incomplete     Studies:  Recent x-ray studies have been reviewed in detail by the Attending Physician  Scheduled Meds:  Scheduled Meds: . antiseptic oral rinse  7 mL Mouth Rinse BID  . azithromycin  500 mg Oral Daily  . cefTRIAXone (ROCEPHIN)  IV  1 g Intravenous Q24H  . folic acid  1 mg Oral Daily  . heparin subcutaneous  5,000 Units Subcutaneous 3 times per day  . nebivolol  10 mg Oral Daily  . pantoprazole  40 mg Oral Q1200  . PARoxetine  20 mg Oral Daily  . thiamine  100 mg Oral Daily    Time spent on care of this patient: 40 mins   Drema Dallas , MD   [facility] Office  [phone] Pager [phone]  On-Call/Text Page:      [email or web address]      password [email or web address]  If 7PM-7AM, please contact night-coverage [email or web address] Password [email or web address] 07/16/2014, 3:52 PM   LOS: 5 days

## 2014-07-16 NOTE — Progress Notes (Signed)
Patient stated that he felt like the Bipap was too much for him and that he would like not to wear it tonight. RT advised the nurse and removed the Bipap off the patient.  RT will continue to monitor.

## 2014-07-17 LAB — FOLATE: Folate: 19 ng/mL

## 2014-07-17 LAB — VITAMIN B12: Vitamin B-12: 939 pg/mL — ABNORMAL HIGH (ref 211–911)

## 2014-07-17 LAB — IRON AND TIBC
Iron: 94 ug/dL (ref 42–135)
SATURATION RATIOS: 33 % (ref 20–55)
TIBC: 286 ug/dL (ref 215–435)
UIBC: 192 ug/dL (ref 125–400)

## 2014-07-17 LAB — RETICULOCYTES
RBC.: 3.63 MIL/uL — ABNORMAL LOW (ref 4.22–5.81)
RETIC CT PCT: 1.9 % (ref 0.4–3.1)
Retic Count, Absolute: 69 10*3/uL (ref 19.0–186.0)

## 2014-07-17 LAB — CLOSTRIDIUM DIFFICILE BY PCR: CDIFFPCR: NEGATIVE

## 2014-07-17 LAB — FERRITIN: Ferritin: 499 ng/mL — ABNORMAL HIGH (ref 22–322)

## 2014-07-17 LAB — LIPASE, BLOOD: Lipase: 92 U/L — ABNORMAL HIGH (ref 11–59)

## 2014-07-17 MED ORDER — DILTIAZEM HCL 60 MG PO TABS
60.0000 mg | ORAL_TABLET | Freq: Two times a day (BID) | ORAL | Status: DC
  Administered 2014-07-17 – 2014-07-18 (×2): 60 mg via ORAL
  Filled 2014-07-17 (×3): qty 1

## 2014-07-17 MED ORDER — LEVALBUTEROL HCL 0.63 MG/3ML IN NEBU: 0.6300 mg | INHALATION_SOLUTION | Freq: Four times a day (QID) | RESPIRATORY_TRACT | Status: DC | PRN

## 2014-07-17 NOTE — Progress Notes (Signed)
Session reviewed, continue with current POC.  Charlotte Crumb, PT DPT  [phone]

## 2014-07-17 NOTE — Progress Notes (Signed)
Pt. Alert and oriented x4.  Pt. Has a mild tremor at this time CIWA protocol to continue.  Pt. Denies n/v/d and pain throughout shift.  Pt. Normal sinus rhythm.  Pt. stable at transfer. Pt. Transferred with belongings to [phone].

## 2014-07-17 NOTE — Progress Notes (Signed)
[facility] TEAM 1 - Stepdown/ICU TEAM Progress Note  Jeffrey Humphrey ZOX:[id number] DOB: 09/13/1956 DOA: 07/11/2014 PCP: No PCP Per Patient  Admit HPI / Brief Narrative: 58 yo M former smoker with hx of alcohol abuse who presented to the ED 9/17 with seizure from hyponatremia (Na 114 on admission). PCCM asked to admit to ICU. Baseline Na noted to be 125-130.  Significant Events:  9/18 admitted for seizures, started 3% NS, intubated for airway protection 9/18 CT head >> No acute process  9/18 CT Cspine >> DJD, no acute traumatic findings.  9/18 EEG > neg focus, enceph noted  9/18 Echo EF 55 to 60% 9/20 A fib with RVR >> added cardizem gtt  9/21 d/c 3% NS 9/22 care assumed by [facility]   HPI/Subjective: Pt sates he feels very tremulous and nervous.  Feels he is still in EtOH withdrawal.  Denies cp, n/v, or abdom pain.    Assessment/Plan:  Acute hypoxic/hypercapnic respiratory failure 2nd to seizure -Resolved  Acute encephalopathy with seizure likely from hyponatremia -Resolved   Hyponatremia 2nd to beer potomania >> baseline Na 125 to 130.  -Patient back to baseline - recheck in AM - TSH and cortisol normal   Hypokalemia -appears to be stabilizing - recheck in AM   CAP / Aspiration ? Has completed 5 days of azithro - continue rocephin for 7 days total tx - clinically much improved - no wheezing so will stop steroids and follow exam   OSA/OHS -CPAP ordered, but pt unwilling to comply - will need sleep study as outpatient  Hx of tobacco abuse ?COPD -no wheezing on exam today - counseled on need to stop smoking - will need outpt PFTs   HTN.  -BP somewhat elevated today - adjust tx further and follow trend   New onset A fib with RVR 9/20.  -remains in NSR today  -Continue Bystolic 10 mg daily -cont Cardizem   -Troponin x3 negative  -May not require anticoagulation if patient remains in NSR, otherwise would start on Eliquis  Seizure activity -seizure secondary to hyponatremia  resolved -EEG negative for seizure activity -Counseled patient that he cannot drive until cleared in follow up by PCP or Neurologist  -PT/OT consultation    H/o GERD -Continue protonix  Mild anemia -likely due to toxic effects of EtOH on bone marrow - check anemia panel to r/o Fe deficiency    Hx of ETOH abuse.  -Counseled patient and father at length on need for absolute abstinence -CIWA with prn Ativan to continue for now as pt remains anxious and tremulous  -Thiamine, folic acid   Hx of depression Continue Paxil   Elevated lipase  No clinical sx to suggest pancreatitis - recheck   Hyperglycemia Check A1c  Code Status: FULL Family Communication: spoke w/ father at bedside at length  Disposition Plan: transfer to tele bed - PT/OT - possible d/c next 24hrs   Consultants: Dr. Max Fickle (PCCM) Dr. Noel Christmas (Neurology)  Antibiotics: Azithromycin 9/18 > 9/23 Ceftriaxone 9/18 >  DVT prophylaxis: Heparin subcutaneous  Objective: Blood pressure 154/89, pulse 81, temperature 97.9 F (36.6 C), temperature source Oral, resp. rate 24, height  (1.702 m), weight 113.5 kg (250 lb 3.6 oz), SpO2 100.00%.  Intake/Output Summary (Last 24 hours) at 07/17/14 1119 Last data filed at 07/17/14 8119  Gross per 24 hour  Intake    720 ml  Output    700 ml  Net     20 ml   Exam: General: A./O.  x4, NAD, No acute respiratory distress Lungs: Clear to auscultation bilaterally without wheezes Cardiovascular: Regular rate and rhythm without murmur gallop or rub normal S1 and S2 Abdomen: Nontender, obese/protuberent, soft, bowel sounds positive, no rebound, no appreciable mass Extremities: No significant cyanosis, clubbing;  Trace edema bilateral lower extremities  Data Reviewed: Basic Metabolic Panel:  Recent Labs Lab 07/12/14 0211 07/12/14 0345  07/12/14 1444  07/13/14 0156  07/14/14 2151 07/15/14 0333 07/15/14 0930 07/15/14 1700 07/16/14 0311  NA 113* 117*   < >  --   < >  --   < > 128* 129* 127* 125* 126*  K 4.1 3.4*  < >  --   < >  --   < > 3.4* 3.6* 3.4* 3.5* 3.6*  CL 77* 77*  < >  --   < >  --   < > 88* 89* 86* 85* 86*  CO2  --  27  < >  --   < >  --   < > GLUCOSE 109* 117*  < >  --   < >  --   < > 110* 118* 106* 106* 114*  BUN 15 11  < >  --   < >  --   < > CREATININE 0.70 0.66  < >  --   < >  --   < > 0.76 0.70 0.72 0.81 0.77  CALCIUM  --  8.1*  < >  --   < >  --   < > 8.9 8.9 9.0 9.2 9.2  MG  --  1.9  --  2.2  --  1.9  --   --   --   --  1.5  --   PHOS  --  2.7  --  1.4*  --  1.8*  --   --   --   --  3.7  --   < > = values in this interval not displayed.  Liver Function Tests:  Recent Labs Lab 07/11/14 2326 07/12/14 0800 07/15/14 1700  AST 114* 111* 37  ALT 130* 121* 59*  ALKPHOS 90 88 106  BILITOT 0.7 0.7 0.6  PROT 6.4 6.3 6.7  ALBUMIN 3.4* 3.3* 3.2*    Recent Labs Lab 07/11/14 2326  LIPASE 276*   CBC:  Recent Labs Lab 07/11/14 2326  07/12/14 0800 07/13/14 0458 07/14/14 0400 07/15/14 0334 07/16/14 0311  WBC 15.6*  < > 12.5* 7.4 8.6 8.9 8.5  NEUTROABS 14.2*  --   --   --   --   --   --   HGB 13.2  < > 12.8* 12.2* 11.2* 11.3* 11.2*  HCT 34.6*  < > 34.3* 33.8* 31.5* 31.8* 32.0*  MCV 89.2  < > 92.5 93.4 94.9 94.9 95.0  PLT 276  < > 249 210 225 228 229  < > = values in this interval not displayed.  Cardiac Enzymes:  Recent Labs Lab 07/11/14 2326 07/15/14 0930 07/15/14 1630 07/15/14 2256  CKTOTAL 415*  --   --   --   TROPONINI  --  <0.30 <0.30 <0.30   CBG:  Recent Labs Lab 07/13/14 1555 07/13/14 2004 07/13/14 2357 07/14/14 0404 07/14/14 0710  GLUCAP 111* 107* 97 110* 142*    Recent Results (from the past 240 hour(s))  MRSA PCR SCREENING     Status: None   Collection Time    07/12/14  2:56 AM  Result Value Ref Range Status   MRSA by PCR NEGATIVE  NEGATIVE Final   Comment:            The GeneXpert MRSA Assay (FDA     approved for NASAL specimens     only),  is one component of a     comprehensive MRSA colonization     surveillance program. It is not     intended to diagnose MRSA     infection nor to guide or     monitor treatment for     MRSA infections.  URINE CULTURE     Status: None   Collection Time    07/12/14  3:13 AM      Result Value Ref Range Status   Specimen Description URINE, CATHETERIZED   Final   Special Requests NONE   Final   Culture  Setup Time     Final   Value: 07/12/2014 09:51     Performed at [facility] Count     Final   Value: NO GROWTH     Performed at [facility]   Culture     Final   Value: NO GROWTH     Performed at [facility]   Report Status 07/13/2014 FINAL   Final  CULTURE, RESPIRATORY (NON-EXPECTORATED)     Status: None   Collection Time    07/12/14  5:45 AM      Result Value Ref Range Status   Specimen Description TRACHEAL ASPIRATE   Final   Special Requests NONE   Final   Gram Stain     Final   Value: FEW WBC PRESENT,BOTH PMN AND MONONUCLEAR     RARE SQUAMOUS EPITHELIAL CELLS PRESENT     MODERATE GRAM POSITIVE COCCI     IN PAIRS IN CLUSTERS FEW GRAM NEGATIVE RODS     Performed at [facility]   Culture     Final   Value: NORMAL OROPHARYNGEAL FLORA     Performed at [facility]   Report Status 07/14/2014 FINAL   Final  CULTURE, BLOOD (ROUTINE X 2)     Status: None   Collection Time    07/12/14  7:50 AM      Result Value Ref Range Status   Specimen Description BLOOD RIGHT HAND   Final   Special Requests BOTTLES DRAWN AEROBIC ONLY 5CC   Final   Culture  Setup Time     Final   Value: 07/12/2014 14:34     Performed at [facility]   Culture     Final   Value:        BLOOD CULTURE RECEIVED NO GROWTH TO DATE CULTURE WILL BE HELD FOR 5 DAYS BEFORE ISSUING A FINAL NEGATIVE REPORT     Performed at [facility]   Report Status PENDING   Incomplete  CULTURE, BLOOD (ROUTINE X 2)     Status: None   Collection Time    07/12/14   8:00 AM      Result Value Ref Range Status   Specimen Description BLOOD LEFT HAND   Final   Special Requests BOTTLES DRAWN AEROBIC ONLY [facility]   Final   Culture  Setup Time     Final   Value: 07/12/2014 14:34     Performed at [facility]   Culture     Final   Value:        BLOOD CULTURE RECEIVED NO GROWTH TO  DATE CULTURE WILL BE HELD FOR 5 DAYS BEFORE ISSUING A FINAL NEGATIVE REPORT     Performed at [facility]   Report Status PENDING   Incomplete     Studies:  Recent x-ray studies have been reviewed in detail by the Attending Physician  Scheduled Meds:  Scheduled Meds: . antiseptic oral rinse  7 mL Mouth Rinse BID  . azithromycin  500 mg Oral Daily  . cefTRIAXone (ROCEPHIN)  IV  1 g Intravenous Q24H  . dextromethorphan-guaiFENesin  1 tablet Oral BID  . diltiazem  30 mg Oral Q12H  . folic acid  1 mg Oral Daily  . heparin subcutaneous  5,000 Units Subcutaneous 3 times per day  . levalbuterol  0.63 mg Nebulization TID  . methylPREDNISolone (SOLU-MEDROL) injection  60 mg Intravenous Q24H  . nebivolol  10 mg Oral Daily  . pantoprazole  40 mg Oral Q1200  . PARoxetine  20 mg Oral Daily  . thiamine  100 mg Oral Daily    Time spent on care of this patient: 35 mins  Lonia Blood, MD [facility] For Consults/Admissions - Flow Manager - [phone] Office  [phone] Pager [phone]  On-Call/Text Page:      [email or web address]      password [facility]  07/17/2014, 11:19 AM   LOS: 6 days

## 2014-07-17 NOTE — Progress Notes (Signed)
Occupational Therapy Evaluation Patient Details Name: Pax Reasoner MRN: [id number] DOB: Mar 06, 1956 Today's Date: 07/17/2014    History of Present Illness Pt is a 58 y.o. male presenting with hyponatremia and is s/p seizing with fall and possible head trauma on 07/11/14. Hx of HTN, anxiety/depression, peripheral edema, ETOH abuse and seizure activity   Clinical Impression   PTA, pt independent with ADL and mobility. Pt overall S/set up for ADL tasks and will be able to D/C home with S when medically stable. During assessment, pt very vocal about desire to stop drinking, appropriately tearful and remorseful when discussing his current situation and resulting hospitalization Pt has a good support system with his 62 yo father, but would be open to learning about other community resources. Rec SW consult for counseling and assist with coping and explore support options for pt.     Follow Up Recommendations  Supervision/Assistance - 24 hour (initially)    Equipment Recommendations  Tub/shower seat    Recommendations for Other Services Other (comment) (social work consult for counseling/resources for AA)     Precautions / Restrictions Precautions Precautions: Fall      Mobility Bed Mobility Overal bed mobility: Modified Independent                Transfers Overall transfer level: Needs assistance Equipment used: 1 person hand held assist Transfers: Sit to/from [employer] Sit to Stand: Supervision Stand pivot transfers: Supervision       General transfer comment: pt able to perform sit to stand without difficulty or unsteadiness upon standing.     Balance Overall balance assessment: Needs assistance Sitting-balance support: Feet supported Sitting balance-Leahy Scale: Good     Standing balance support: Single extremity supported Standing balance-Leahy Scale: Fair Standing balance comment: Pt able to stand without support, but difficulty maintaining  stability with weight shifts and challenge             High level balance activites: Other (comment);Head turns (Single leg stance with toe taps with opposite LE) High Level Balance Comments: Pt requires hand held A to maintain balance with head turns looking up during ambulation. Reduced assistance required with practic.            ADL Overall ADL's : Needs assistance/impaired     Grooming: Set up   Upper Body Bathing: Set up   Lower Body Bathing: Set up;Supervison/ safety   Upper Body Dressing : Set up   Lower Body Dressing: Supervision/safety;Set up   Toilet Transfer: Min guard   Toileting- Clothing Manipulation and Hygiene: Supervision/safety;Sit to/from stand       Functional mobility during ADLs: Min guard General ADL Comments: overall S for bathing/dressing. Educated pt on recommendation to use shower chair and to sit as much as possible with bathing and dressing to reduce risk  of fals. Pt will have assistance as needed for ADL     Vision                     Perception     Praxis      Pertinent Vitals/Pain Pain Assessment: No/denies pain     Hand Dominance Right   Extremity/Trunk Assessment Upper Extremity Assessment Upper Extremity Assessment: Overall WFL for tasks assessed   Lower Extremity Assessment Lower Extremity Assessment: Defer to PT evaluation   Cervical / Trunk Assessment Cervical / Trunk Assessment: Normal   Communication Communication Communication: No difficulties   Cognition Arousal/Alertness: Awake/alert Behavior During Therapy: Impulsive (very tearful at times)  Overall Cognitive Status: Impaired/Different from baseline Area of Impairment: Safety/judgement;Awareness;Problem solving;Attention   Current Attention Level: Alternating Memory: Decreased short-term memory Following Commands: Follows multi-step commands consistently Safety/Judgement: Decreased awareness of safety Awareness: Emergent Problem Solving:  Slow processing General Comments: Pt demonstrating insight into situation and became appropriately tearful..Pt vocalizing desire to stop drinking and improve his quality of life.    General Comments       Exercises Exercises: Other exercises Other Exercises Other Exercises:  (Min A with hand hold assist for stability.)   Shoulder Instructions      Home Living Family/patient expects to be discharged to:: Private residence Living Arrangements: Spouse/significant other;Parent Available Help at Discharge: Family;Friend(s);Available 24 hours/day Type of Home: House Home Access: Level entry     Home Layout: One level     Bathroom Shower/Tub: Producer, television/film/video: Standard Bathroom Accessibility: Yes How Accessible: Accessible via walker Home Equipment: None          Prior Functioning/Environment Level of Independence: Independent        Comments: recently lost job 9/17    OT Diagnosis:     OT Problem List:     OT Treatment/Interventions:      OT Goals(Current goals can be found in the care plan section) Acute Rehab OT Goals Patient Stated Goal: to go home OT Goal Formulation:  (eval only)  OT Frequency:     Barriers to D/C:            Co-evaluation              End of Session Equipment Utilized During Treatment: Gait belt Nurse Communication: Mobility status;Other (comment) (pt vocalizing need to quit drinking)  Activity Tolerance: Patient tolerated treatment well Patient left: in chair;with call bell/phone within reach   Time: 1435-1522 OT Time Calculation (min): 47 min Charges:  OT General Charges $OT Visit: 1 Procedure OT Evaluation $Initial OT Evaluation Tier I: 1 Procedure OT Treatments $Self Care/Home Management : 23-37 mins G-Codes:    Craige Patel,HILLARY August 11, 2014, 4:16 PM   [facility], OTR/L  [phone] 11-Aug-2014

## 2014-07-17 NOTE — Progress Notes (Signed)
Physical Therapy Treatment Patient Details Name: Jeffrey Humphrey MRN: [id number] DOB: 1956-10-20 Today's Date: 07/17/2014    History of Present Illness Pt is a 58 y.o. male presenting with hyponatremia and is s/p seizing with fall and possible head trauma on 07/11/14. Hx of HTN, anxiety/depression, peripheral edema, ETOH abuse and seizure activity    PT Comments    Pt progressing towards goals. Pt demonstrating improvement in LE coordination and balance. Pt eager to get out of room and encouraged to notify nurses if he wants to walk more.   Follow Up Recommendations  Supervision/Assistance - 24 hour;Home health PT     Equipment Recommendations  Other (comment) (tbd)    Recommendations for Other Services       Precautions / Restrictions Precautions Precautions: Fall    Mobility  Bed Mobility                  Transfers Overall transfer level: Needs assistance   Transfers: Sit to/from Stand Sit to Stand: Supervision         General transfer comment: pt able to perform sit to stand without difficulty or unsteadiness upon standing.   Ambulation/Gait Ambulation/Gait assistance: Min assist;+2 safety/equipment Ambulation Distance (Feet): 150 Feet Assistive device: 1 person hand held assist Gait Pattern/deviations: Step-through pattern;Decreased stride length;Ataxic;Staggering left;Staggering right;Narrow base of support Gait velocity: slow Gait velocity interpretation: Below normal speed for age/gender General Gait Details: Pt requires A for higher level balance activities assessed during ambulation.    Stairs            Wheelchair Mobility    Modified Rankin (Stroke Patients Only)       Balance Overall balance assessment: Needs assistance Sitting-balance support: Feet supported Sitting balance-Leahy Scale: Good     Standing balance support: Single extremity supported Standing balance-Leahy Scale: Fair Standing balance comment: Pt able to stand  without support, but difficulty maintaining stability with weight shifts and challenge             High level balance activites: Other (comment);Head turns (Single leg stance with toe taps with opposite LE) High Level Balance Comments: Pt requires hand held A to maintain balance with head turns looking up during ambulation. 1 bout of instability with looking up during amb requiring mod A to correct. Reduced assistance required with practice.     Cognition Arousal/Alertness: Awake/alert Behavior During Therapy: WFL for tasks assessed/performed;Restless Overall Cognitive Status: Impaired/Different from baseline Area of Impairment: Safety/judgement;Awareness;Problem solving   Current Attention Level: Divided   Following Commands: Follows one step commands consistently Safety/Judgement: Decreased awareness of safety;Decreased awareness of deficits Awareness: Emergent Problem Solving: Requires verbal cues General Comments: pt with reduced awareness of deficits and safety. improved attention, able walk and follow instructions of  where to direct gaze.  Pt able to identify difficulty during ambulation, but requires cueing to assist in problem solving.    Exercises Other Exercises Other Exercises: Single leg stance with toe taps forward, 45deg anterolateral and laterally. 5x B LE (Min A with hand hold assist for stability.)    General Comments General comments (skin integrity, edema, etc.): Coordination during ambulationnoticably improved since initial eval. Pt requires less A with turns during ambulation and min A  for stability with heads turns L and R. ModA initially for looking up during ambulation,       Pertinent Vitals/Pain Pain Assessment: No/denies pain    Home Living  Prior Function            PT Goals (current goals can now be found in the care plan section) Acute Rehab PT Goals Patient Stated Goal: to go home PT Goal Formulation: With  patient Time For Goal Achievement: 07/29/14 Potential to Achieve Goals: Good Progress towards PT goals: Progressing toward goals    Frequency  Min 3X/week    PT Plan Current plan remains appropriate    Co-evaluation             End of Session Equipment Utilized During Treatment: Gait belt Activity Tolerance: Patient tolerated treatment well Patient left: in chair;with call bell/phone within reach     Time: 1610-9604 PT Time Calculation (min): 13 min  Charges:                       G Codes:      Velita Quirk 07/17/2014, 2:54 PM

## 2014-07-18 DIAGNOSIS — J189 Pneumonia, unspecified organism: Secondary | ICD-10-CM | POA: Diagnosis present

## 2014-07-18 DIAGNOSIS — R569 Unspecified convulsions: Secondary | ICD-10-CM | POA: Diagnosis present

## 2014-07-18 DIAGNOSIS — F32A Depression, unspecified: Secondary | ICD-10-CM | POA: Diagnosis present

## 2014-07-18 DIAGNOSIS — G4733 Obstructive sleep apnea (adult) (pediatric): Secondary | ICD-10-CM | POA: Diagnosis present

## 2014-07-18 DIAGNOSIS — I10 Essential (primary) hypertension: Secondary | ICD-10-CM | POA: Diagnosis present

## 2014-07-18 DIAGNOSIS — F1010 Alcohol abuse, uncomplicated: Secondary | ICD-10-CM | POA: Diagnosis present

## 2014-07-18 DIAGNOSIS — G9340 Encephalopathy, unspecified: Secondary | ICD-10-CM

## 2014-07-18 DIAGNOSIS — I4891 Unspecified atrial fibrillation: Secondary | ICD-10-CM | POA: Diagnosis present

## 2014-07-18 DIAGNOSIS — K219 Gastro-esophageal reflux disease without esophagitis: Secondary | ICD-10-CM | POA: Diagnosis present

## 2014-07-18 DIAGNOSIS — E876 Hypokalemia: Secondary | ICD-10-CM | POA: Diagnosis present

## 2014-07-18 DIAGNOSIS — Z720 Tobacco use: Secondary | ICD-10-CM | POA: Diagnosis present

## 2014-07-18 DIAGNOSIS — F3289 Other specified depressive episodes: Secondary | ICD-10-CM

## 2014-07-18 DIAGNOSIS — J9601 Acute respiratory failure with hypoxia: Secondary | ICD-10-CM | POA: Diagnosis present

## 2014-07-18 DIAGNOSIS — F329 Major depressive disorder, single episode, unspecified: Secondary | ICD-10-CM | POA: Diagnosis present

## 2014-07-18 DIAGNOSIS — F17200 Nicotine dependence, unspecified, uncomplicated: Secondary | ICD-10-CM

## 2014-07-18 LAB — CBC
HEMATOCRIT: 34 % — AB (ref 39.0–52.0)
Hemoglobin: 11.9 g/dL — ABNORMAL LOW (ref 13.0–17.0)
MCH: 33.2 pg (ref 26.0–34.0)
MCHC: 35 g/dL (ref 30.0–36.0)
MCV: 95 fL (ref 78.0–100.0)
Platelets: 359 10*3/uL (ref 150–400)
RBC: 3.58 MIL/uL — ABNORMAL LOW (ref 4.22–5.81)
RDW: 12.6 % (ref 11.5–15.5)
WBC: 10 10*3/uL (ref 4.0–10.5)

## 2014-07-18 LAB — COMPREHENSIVE METABOLIC PANEL
ALBUMIN: 3.6 g/dL (ref 3.5–5.2)
ALK PHOS: 97 U/L (ref 39–117)
ALT: 64 U/L — ABNORMAL HIGH (ref 0–53)
AST: 54 U/L — ABNORMAL HIGH (ref 0–37)
Anion gap: 14 (ref 5–15)
BILIRUBIN TOTAL: 0.5 mg/dL (ref 0.3–1.2)
BUN: 21 mg/dL (ref 6–23)
CHLORIDE: 87 meq/L — AB (ref 96–112)
CO2: 27 meq/L (ref 19–32)
CREATININE: 0.79 mg/dL (ref 0.50–1.35)
Calcium: 10 mg/dL (ref 8.4–10.5)
GFR calc Af Amer: >90 mL/min (ref >90)
Glucose, Bld: 116 mg/dL — ABNORMAL HIGH (ref 70–99)
POTASSIUM: 3.7 meq/L (ref 3.7–5.3)
Sodium: 128 mEq/L — ABNORMAL LOW (ref 137–147)
Total Protein: 7 g/dL (ref 6.0–8.3)

## 2014-07-18 LAB — CULTURE, BLOOD (ROUTINE X 2)
CULTURE: NO GROWTH
Culture: NO GROWTH

## 2014-07-18 MED ORDER — FOLIC ACID 1 MG PO TABS: 1.0000 mg | ORAL_TABLET | Freq: Every day | ORAL | Status: DC

## 2014-07-18 MED ORDER — DILTIAZEM HCL 60 MG PO TABS: 60.0000 mg | ORAL_TABLET | Freq: Two times a day (BID) | ORAL | Status: DC

## 2014-07-18 MED ORDER — DM-GUAIFENESIN ER 30-600 MG PO TB12: 1.0000 | ORAL_TABLET | Freq: Two times a day (BID) | ORAL | Status: DC

## 2014-07-18 MED ORDER — LEVOFLOXACIN 750 MG PO TABS
750.0000 mg | ORAL_TABLET | Freq: Every day | ORAL | Status: DC
  Filled 2014-07-18: qty 1

## 2014-07-18 MED ORDER — LEVOFLOXACIN 750 MG PO TABS: 750.0000 mg | ORAL_TABLET | Freq: Every day | ORAL | Status: DC

## 2014-07-18 MED ORDER — THIAMINE HCL 100 MG PO TABS: 100.0000 mg | ORAL_TABLET | Freq: Every day | ORAL | Status: AC

## 2014-07-18 NOTE — Care Management Note (Signed)
    Page 1 of 1   07/18/2014     2:24:40 PM CARE MANAGEMENT NOTE 07/18/2014  Patient:  RIGLEY, NIESS   Account Number:  [id number]  Date Initiated:  07/15/2014  Documentation initiated by:  Donn Pierini  Subjective/Objective Assessment:   Pt admitted with hyponatremia, seizures,     Action/Plan:   PTA pt lived at home PT eval pending   Anticipated DC Date:  07/18/2014   Anticipated DC Plan:    In-house referral  Clinical Social Worker      DC Planning Services  CM consult      Choice offered to / List presented to:             Status of service:  Completed, signed off Medicare Important Message given?  NO (If response is "NO", the following Medicare IM given date fields will be blank) Date Medicare IM given:   Medicare IM given by:   Date Additional Medicare IM given:   Additional Medicare IM given by:    Discharge Disposition:  HOME/SELF CARE  Per UR Regulation:  Reviewed for med. necessity/level of care/duration of stay  If discussed at Long Length of Stay Meetings, dates discussed:   07/16/2014  07/18/2014    Comments:  07-18-14 [address], RN,BSN [phone] CM did set pt up with PCP. Per pt he has insurance card, however does not have card available. CM did make him aware to call admitting and billing. No further needs from CM at this time.

## 2014-07-18 NOTE — Progress Notes (Signed)
NUTRITION FOLLOW UP  Intervention:   Continue regular diet to meet nutrition needs.  Nutrition Dx:   Inadequate oral intake, resolved.  No new nutrition diagnosis at this time.   Goal:   Intake to meet >90% of estimated nutrition needs.  Monitor:   PO intake, labs, weight trend.  Assessment:   58 year old male from home with seizure like activity. Required large amounts of benzo via EMS. In ED was obtunded with questionable airway protection. Reportedly drinks 10 beers per day. Hyponatremic 113 in ED.  Patient was intubated from 9/18-9/19. Received TF during that time. TF has been off since extubation. Patient is eating well, current intake is meeting estimated nutrition needs.  Height: Ht Readings from Last 1 Encounters:  07/17/14  (1.778 m)    Weight Status:  stable Wt Readings from Last 1 Encounters:  07/18/14 243 lb 3.2 oz (110.315 kg)  07/12/14  243 lb 13.3 oz (110.6 kg)   Body mass index is 34.9 kg/(m^2).   Re-estimated needs:  Kcal: 2000-2200 Protein: 115-130 gm Fluid: 2-2.2 L  Skin: WDL  Diet Order: General   Intake/Output Summary (Last 24 hours) at 07/18/14 1038 Last data filed at 07/17/14 1300  Gross per 24 hour  Intake    240 ml  Output      0 ml  Net    240 ml    Last BM: 9/24   Labs:   Recent Labs Lab 07/12/14 1444  07/13/14 0156  07/15/14 1700 07/16/14 0311 07/18/14 0625  NA  --   < >  --   < > 125* 126* 128*  K  --   < >  --   < > 3.5* 3.6* 3.7  CL  --   < >  --   < > 85* 86* 87*  CO2  --   < >  --   < > BUN  --   < >  --   < > CREATININE  --   < >  --   < > 0.81 0.77 0.79  CALCIUM  --   < >  --   < > 9.2 9.2 10.0  MG 2.2  --  1.9  --  1.5  --   --   PHOS 1.4*  --  1.8*  --  3.7  --   --   GLUCOSE  --   < >  --   < > 106* 114* 116*  < > = values in this interval not displayed.  CBG (last 3)  No results found for this basename: GLUCAP,  in the last 72 hours  Scheduled Meds: . antiseptic oral rinse   7 mL Mouth Rinse BID  . cefTRIAXone (ROCEPHIN)  IV  1 g Intravenous Q24H  . dextromethorphan-guaiFENesin  1 tablet Oral BID  . diltiazem  60 mg Oral Q12H  . folic acid  1 mg Oral Daily  . heparin subcutaneous  5,000 Units Subcutaneous 3 times per day  . nebivolol  10 mg Oral Daily  . pantoprazole  40 mg Oral Q1200  . PARoxetine  20 mg Oral Daily  . thiamine  100 mg Oral Daily    Continuous Infusions: . sodium chloride 10 mL/hr at 07/17/14 1345    Joaquin Courts, RD, LDN, CNSC Pager [phone] After Hours Pager [phone]

## 2014-07-18 NOTE — Discharge Summary (Signed)
Physician Discharge Summary  Jeffrey Humphrey OZH:[id number] DOB: [birth year] DOA: 07/11/2014  PCP: No PCP Per Patient  Admit date: 07/11/2014 Discharge date: 07/18/2014  Time spent: 40 minutes  Recommendations for Outpatient Follow-up:  Acute hypoxic/hypercapnic respiratory failure 2nd to seizure  -Resolved  -Followup with PCP to monitor electrolytes particularly sodium level  Acute encephalopathy with seizure likely from hyponatremia  -Resolved  -Patient still hyponatremic and significantly improved and asymptomatic, counseled that continuing to refrain from alcohol use will electrolytes to normalize.  -Followup with PCP   Hyponatremia 2nd to beer potomania >> baseline Na 125 to 130.  -Abstinence from alcohol use    Hypokalemia  -Resolved PCP to monitor   -TSH within normal limit  -Cortisol within normal limit   CAP/Aspiration ?  -Levofloxacin 750 mg daily x4 days upon discharge   -Continue Xopenex BID  -Mucinex DM BID  -Flutter valve  -Followup with PCP  OSA/OHS.  -Patient will need to followup with [facility] in order to set up sleep study and obtain a CPAP machine  Hx of tobacco abuse ?obstructive lung disease.  -See OSA/OHS  -Counseled patient on abstinence of tobacco use  HTN.  -Within AHA guidelines  -See A. fib with RVR   New onset A fib with RVR 9/20.  -Currently in NSR  -Continue Bystolic 10 mg daily  -Continue Cardizem 60 mg BID  -Will not start anticoagulation since patient is in normal sinus rhythm, patient to followup with PCP in one week and if still in NSR recommended not starting anti-coagulation -Continue aspirin 325 mg daily -If patient does report to A. fib RVR recommend starting patient on Eliquis   Seizure activity -seizure secondary to hypernatremia resolved  -EEG negative for seizure activity  -Counseled patient that he cannot drive until cleared by neurologist  -Followup with [facility] in 30 days to request  driving privileges be restored.    H/o GERD.  -Resume Prilosec 20 mg daily   Mild anemia.  -F/u CBC intermittently by PCP  Hx of ETOH abuse.  -Counseled patient and father on need for abstinence  -Continue Thiamine, folic acid  -Followup with PCP to evaluate electrolyte abnormalities within 2 weeks  Hx of depression.  Continue Paxil  F/u with PCP   Discharge Diagnoses:  Active Problems:   Hyponatremia   Acute respiratory failure with hypoxia   Seizures   Acute encephalopathy   Hypokalemia   CAP (community acquired pneumonia)   Obstructive sleep apnea   Tobacco abuse   HTN (hypertension)   Atrial fibrillation with RVR   Depression   GERD (gastroesophageal reflux disease)   Alcohol abuse   Discharge Condition: Stable  Diet recommendation: Regular  Filed Weights   07/17/14 0406 07/17/14 1604 07/18/14 0500  Weight: 113.5 kg (250 lb 3.6 oz) 110.995 kg (244 lb 11.2 oz) 110.315 kg (243 lb 3.2 oz)    History of present illness:  58 yo WM PMHx male former smoker with hx of alcohol abuse presented to ED with seizure from hyponatremia (Na 114 on admission). PCCM asked to admit to ICU. Baseline Na is 125 to 130. Patient's seizures have resolved since his hyponatremia has resolved. Counseled patient that he would need to followup with a neurologist in order to clear him for driving.     Consultants:  -Dr. Max Fickle [facility] M.)  -Dr. 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  9/18 EEG >> neg focus, enceph noted  9/18 Echo EF 55 to 60%  9/18 admitted for seizures, start 3% NS, intubated for airway protection  9/20 A fib with RVR >> added cardizem gtt  9/21 d/c 3% NS    Culture  9/18 MRSA by PCR negative  9/18 urine negative  9/18 respiratory culture negative  9/18 blood right/left hand NGTD    Antibiotics:  Azithromycin 9/22>> stopped  9/23 Ceftriaxone 9/18>> stopped 9/24     Discharge Exam: Filed Vitals:   07/17/14 1604 07/17/14 1953 07/18/14 0500 07/18/14 1100  BP: 157/89 158/98 146/82 150/80  Pulse: 74 72 67 74  Temp: 99.1 F (37.3 C) 98.1 F (36.7 C) 98.2 F (36.8 C)   TempSrc: Oral Oral Oral   Resp: Height:  (1.778 m)     Weight: 110.995 kg (244 lb 11.2 oz)  110.315 kg (243 lb 3.2 oz)   SpO2: 95% 93% 97%    General: A./O. x4, NAD, No acute respiratory distress  Lungs: Clear to auscultation bilaterally without wheezes or crackles  Cardiovascular: Regular rate and rhythm without murmur gallop or rub normal S1 and S2  Abdomen: Nontender, nondistended, soft, bowel sounds positive, no rebound, no ascites, no appreciable mass  Extremities: No significant cyanosis, clubbing, or edema bilateral lower extremities  Neurologic; cranial nerves II through XII intact, tongue/uvula midline, extremity strength 5/5, sensation intact, bilateral asterixis has resolved  Discharge Instructions     Medication List    STOP taking these medications       furosemide 20 MG tablet  Commonly known as:  LASIX     GOODY HEADACHE PO     NYQUIL PO      TAKE these medications       ALPRAZolam 0.5 MG tablet  Commonly known as:  XANAX  Take 0.5 mg by mouth 2 (two) times daily as needed for anxiety.     dextromethorphan-guaiFENesin 30-600 MG per 12 hr tablet  Commonly known as:  MUCINEX DM  Take 1 tablet by mouth 2 (two) times daily.     diltiazem 60 MG tablet  Commonly known as:  CARDIZEM  Take 1 tablet (60 mg total) by mouth every 12 (twelve) hours.     diphenhydrAMINE 25 MG tablet  Commonly known as:  BENADRYL  Take 25 mg by mouth at bedtime.     folic acid 1 MG tablet  Commonly known as:  FOLVITE  Take 1 tablet (1 mg total) by mouth daily.     Ginkgo Biloba 60 MG Tabs  Take 60 mg by mouth daily.     ibuprofen 200 MG tablet  Commonly known as:  ADVIL,MOTRIN  Take 200 mg by mouth every 6  (six) hours as needed for moderate pain.     levofloxacin 750 MG tablet  Commonly known as:  LEVAQUIN  Take 1 tablet (750 mg total) by mouth daily.     losartan 100 MG tablet  Commonly known as:  COZAAR  Take 100 mg by mouth daily.     Magnesium 250 MG Tabs  Take 250 mg by mouth daily.     milk thistle 175 MG tablet  Take 175 mg by mouth daily.     naproxen sodium 220 MG tablet  Commonly known as:  ANAPROX  Take 220 mg by mouth 2 (two) times daily as needed (for pain).     nebivolol 10 MG tablet  Commonly known as:  BYSTOLIC  Take  10 mg by mouth daily.     omeprazole 20 MG tablet  Commonly known as:  PRILOSEC OTC  Take 20 mg by mouth daily.     PARoxetine 20 MG tablet  Commonly known as:  PAXIL  Take 20 mg by mouth daily.     thiamine 100 MG tablet  Take 1 tablet (100 mg total) by mouth daily.     Zinc 50 MG Tabs  Take 50 mg by mouth daily.       No Known Allergies Follow-up Information   Follow up with BOUSKA,DAVID E, MD. Schedule an appointment as soon as possible for a visit in 1 week. [facility] followup; hyponatremia secondary to alcohol abuse, causing seizures. Monitor patient's electrolytes)    Specialty:  Family Medicine   Contact information:   [address] [place] [address] [phone]       Follow up with [facility]. Schedule an appointment as soon as possible for a visit in 1 month. (Patient with seizures secondary to electrolyte abnormalities secondary to alcohol abuse requires clearance to operate motor vehicles)    Contact information:   [address] [place] [address] [phone]       The results of significant diagnostics from this hospitalization (including imaging, microbiology, ancillary and laboratory) are listed below for reference.    Significant Diagnostic Studies: Ct Head Wo Contrast  07/12/2014   CLINICAL DATA:  Altered mental status, seizure  EXAM: CT HEAD WITHOUT CONTRAST  CT  CERVICAL SPINE WITHOUT CONTRAST  TECHNIQUE: Multidetector CT imaging of the head and cervical spine was performed following the standard protocol without intravenous contrast. Multiplanar CT image reconstructions of the cervical spine were also generated.  COMPARISON:  None.  FINDINGS: CT HEAD FINDINGS  There is no acute intracranial hemorrhage or infarct. No mass lesion or midline shift. Gray-white matter differentiation is well maintained. Ventricles are normal in size without evidence of hydrocephalus. CSF containing spaces are within normal limits. No extra-axial fluid collection.  The calvarium is intact.  Orbital soft tissues are within normal limits.  The paranasal sinuses and mastoid air cells are well pneumatized and free of fluid.  Scalp soft tissues are unremarkable.  CT CERVICAL SPINE FINDINGS  Study is degraded by motion artifact.  There is reversal of the normal cervical lordosis with apex at C4-5. No listhesis. Vertebral body heights are preserved. Normal C1-2 articulations are intact. There is fullness of the prevertebral soft tissues with the internal carotid arteries medialized into the retropharyngeal space. No retropharyngeal hematoma or other abnormality. No acute fracture or listhesis.  Advanced multilevel degenerative disc disease is evidenced by intervertebral disc space narrowing, endplate sclerosis, and osteophytosis is seen throughout the cervical spine, most evident at C4-5, C5-6, and C6-7.  Visualized soft tissues of the neck are within normal limits. Visualized lung apices are clear without evidence of apical pneumothorax.  IMPRESSION: CT BRAIN:  No acute intracranial process.  CT CERVICAL SPINE:  1. Motion degraded study with no definite acute traumatic injury within the cervical spine. 2. Reversal of the normal cervical lordosis with moderate multilevel degenerative disc disease, most severe at C4-5 through C6-7.   Electronically Signed   By: Rise Mu M.D.   On: 07/12/2014  01:28   Ct Cervical Spine Wo Contrast  07/12/2014   CLINICAL DATA:  Altered mental status, seizure  EXAM: CT HEAD WITHOUT CONTRAST  CT CERVICAL SPINE WITHOUT CONTRAST  TECHNIQUE: Multidetector CT imaging of the head and cervical spine  was performed following the standard protocol without intravenous contrast. Multiplanar CT image reconstructions of the cervical spine were also generated.  COMPARISON:  None.  FINDINGS: CT HEAD FINDINGS  There is no acute intracranial hemorrhage or infarct. No mass lesion or midline shift. Gray-white matter differentiation is well maintained. Ventricles are normal in size without evidence of hydrocephalus. CSF containing spaces are within normal limits. No extra-axial fluid collection.  The calvarium is intact.  Orbital soft tissues are within normal limits.  The paranasal sinuses and mastoid air cells are well pneumatized and free of fluid.  Scalp soft tissues are unremarkable.  CT CERVICAL SPINE FINDINGS  Study is degraded by motion artifact.  There is reversal of the normal cervical lordosis with apex at C4-5. No listhesis. Vertebral body heights are preserved. Normal C1-2 articulations are intact. There is fullness of the prevertebral soft tissues with the internal carotid arteries medialized into the retropharyngeal space. No retropharyngeal hematoma or other abnormality. No acute fracture or listhesis.  Advanced multilevel degenerative disc disease is evidenced by intervertebral disc space narrowing, endplate sclerosis, and osteophytosis is seen throughout the cervical spine, most evident at C4-5, C5-6, and C6-7.  Visualized soft tissues of the neck are within normal limits. Visualized lung apices are clear without evidence of apical pneumothorax.  IMPRESSION: CT BRAIN:  No acute intracranial process.  CT CERVICAL SPINE:  1. Motion degraded study with no definite acute traumatic injury within the cervical spine. 2. Reversal of the normal cervical lordosis with moderate  multilevel degenerative disc disease, most severe at C4-5 through C6-7.   Electronically Signed   By: Rise Mu M.D.   On: 07/12/2014 01:28   Dg Chest Port 1 View  07/12/2014   CLINICAL DATA:  Endotracheal tube placement.  EXAM: PORTABLE CHEST - 1 VIEW  COMPARISON:  Chest radiograph performed earlier today at 1:30 a.m.  FINDINGS: The patient's endotracheal tube is seen ending 2-3 cm above the carina. An enteric tube noted extending below the diaphragm. A right IJ line is noted ending about the mid SVC.  The lungs are hypoexpanded. Left basilar airspace opacity may reflect atelectasis or pneumonia. Vascular congestion and vascular crowding are seen. A small left pleural effusion is suspected. No pneumothorax is seen.  The cardiomediastinal silhouette is borderline enlarged. No acute osseous abnormalities are identified.  IMPRESSION: 1. Endotracheal tube seen ending 2-3 cm above the carina. 2. Lungs hypoexpanded. Left basilar airspace opacity may reflect atelectasis or pneumonia. 3. Vascular congestion and borderline cardiomegaly. Small left pleural effusion suspected.   Electronically Signed   By: Roanna Raider M.D.   On: 07/12/2014 05:43   Dg Chest Portable 1 View  07/12/2014   CLINICAL DATA:  Central line placement.  EXAM: PORTABLE CHEST - 1 VIEW  COMPARISON:  None.  FINDINGS: Right IJ central line is in place, tip overlying the level of the superior vena cava. The patient is rotated towards the left. The heart is enlarged. No evidence for edema. No pneumothorax.  IMPRESSION: Right IJ central line tip overlying the superior vena cava.   Electronically Signed   By: Rosalie Gums M.D.   On: 07/12/2014 01:50    Microbiology: Recent Results (from the past 240 hour(s))  MRSA PCR SCREENING     Status: None   Collection Time    07/12/14  2:56 AM      Result Value Ref Range Status   MRSA by PCR NEGATIVE  NEGATIVE Final   Comment:  The GeneXpert MRSA Assay (FDA     approved for NASAL  specimens     only), is one component of a     comprehensive MRSA colonization     surveillance program. It is not     intended to diagnose MRSA     infection nor to guide or     monitor treatment for     MRSA infections.  URINE CULTURE     Status: None   Collection Time    07/12/14  3:13 AM      Result Value Ref Range Status   Specimen Description URINE, CATHETERIZED   Final   Special Requests NONE   Final   Culture  Setup Time     Final   Value: 07/12/2014 09:51     Performed at [facility] Count     Final   Value: NO GROWTH     Performed at [facility]   Culture     Final   Value: NO GROWTH     Performed at [facility]   Report Status 07/13/2014 FINAL   Final  CULTURE, RESPIRATORY (NON-EXPECTORATED)     Status: None   Collection Time    07/12/14  5:45 AM      Result Value Ref Range Status   Specimen Description TRACHEAL ASPIRATE   Final   Special Requests NONE   Final   Gram Stain     Final   Value: FEW WBC PRESENT,BOTH PMN AND MONONUCLEAR     RARE SQUAMOUS EPITHELIAL CELLS PRESENT     MODERATE GRAM POSITIVE COCCI     IN PAIRS IN CLUSTERS FEW GRAM NEGATIVE RODS     Performed at [facility]   Culture     Final   Value: NORMAL OROPHARYNGEAL FLORA     Performed at [facility]   Report Status 07/14/2014 FINAL   Final  CULTURE, BLOOD (ROUTINE X 2)     Status: None   Collection Time    07/12/14  7:50 AM      Result Value Ref Range Status   Specimen Description BLOOD RIGHT HAND   Final   Special Requests BOTTLES DRAWN AEROBIC ONLY 5CC   Final   Culture  Setup Time     Final   Value: 07/12/2014 14:34     Performed at [facility]   Culture     Final   Value: NO GROWTH 5 DAYS     Performed at [facility]   Report Status 07/18/2014 FINAL   Final  CULTURE, BLOOD (ROUTINE X 2)     Status: None   Collection Time    07/12/14  8:00 AM      Result Value Ref Range Status   Specimen Description BLOOD LEFT  HAND   Final   Special Requests BOTTLES DRAWN AEROBIC ONLY [facility]   Final   Culture  Setup Time     Final   Value: 07/12/2014 14:34     Performed at [facility]   Culture     Final   Value: NO GROWTH 5 DAYS     Performed at [facility]   Report Status 07/18/2014 FINAL   Final  CLOSTRIDIUM DIFFICILE BY PCR     Status: None   Collection Time    07/17/14 12:51 AM      Result Value Ref Range Status   C difficile by pcr NEGATIVE  NEGATIVE Final  Labs: Basic Metabolic Panel:  Recent Labs Lab 07/12/14 0211 07/12/14 0345  07/12/14 1444  07/13/14 0156  07/15/14 0333 07/15/14 0930 07/15/14 1700 07/16/14 0311 07/18/14 0625  NA 113* 117*  < >  --   < >  --   < > 129* 127* 125* 126* 128*  K 4.1 3.4*  < >  --   < >  --   < > 3.6* 3.4* 3.5* 3.6* 3.7  CL 77* 77*  < >  --   < >  --   < > 89* 86* 85* 86* 87*  CO2  --  27  < >  --   < >  --   < > GLUCOSE 109* 117*  < >  --   < >  --   < > 118* 106* 106* 114* 116*  BUN 15 11  < >  --   < >  --   < > CREATININE 0.70 0.66  < >  --   < >  --   < > 0.70 0.72 0.81 0.77 0.79  CALCIUM  --  8.1*  < >  --   < >  --   < > 8.9 9.0 9.2 9.2 10.0  MG  --  1.9  --  2.2  --  1.9  --   --   --  1.5  --   --   PHOS  --  2.7  --  1.4*  --  1.8*  --   --   --  3.7  --   --   < > = values in this interval not displayed. Liver Function Tests:  Recent Labs Lab 07/11/14 2326 07/12/14 0800 07/15/14 1700 07/18/14 0625  AST 114* 111* 37 54*  ALT 130* 121* 59* 64*  ALKPHOS 90 88 106 97  BILITOT 0.7 0.7 0.6 0.5  PROT 6.4 6.3 6.7 7.0  ALBUMIN 3.4* 3.3* 3.2* 3.6    Recent Labs Lab 07/11/14 2326 07/17/14 1405  LIPASE 276* 92*   No results found for this basename: AMMONIA,  in the last 168 hours CBC:  Recent Labs Lab 07/11/14 2326  07/13/14 0458 07/14/14 0400 07/15/14 0334 07/16/14 0311 07/18/14 0605  WBC 15.6*  < > 7.4 8.6 8.9 8.5 10.0  NEUTROABS 14.2*  --   --   --   --   --   --   HGB 13.2  < >  12.2* 11.2* 11.3* 11.2* 11.9*  HCT 34.6*  < > 33.8* 31.5* 31.8* 32.0* 34.0*  MCV 89.2  < > 93.4 94.9 94.9 95.0 95.0  PLT 276  < > 210 225 228 229 359  < > = values in this interval not displayed. Cardiac Enzymes:  Recent Labs Lab 07/11/14 2326 07/15/14 0930 07/15/14 1630 07/15/14 2256  CKTOTAL 415*  --   --   --   TROPONINI  --  <0.30 <0.30 <0.30   BNP: BNP (last 3 results)  Recent Labs  07/12/14 0800  PROBNP 367.2*   CBG:  Recent Labs Lab 07/13/14 1555 07/13/14 2004 07/13/14 2357 07/14/14 0404 07/14/14 0710  GLUCAP 111* 107* 97 110* 142*       Signed:  Carolyne Littles, MD [facility] [phone] pager

## 2014-08-08 ENCOUNTER — Encounter: Payer: Self-pay | Admitting: Neurology

## 2014-08-08 ENCOUNTER — Ambulatory Visit (INDEPENDENT_AMBULATORY_CARE_PROVIDER_SITE_OTHER): Payer: 59 | Admitting: Neurology

## 2014-08-08 VITALS — BP 90/57 | HR 57 | Ht 70.0 in | Wt 235.0 lb

## 2014-08-08 DIAGNOSIS — G9340 Encephalopathy, unspecified: Secondary | ICD-10-CM

## 2014-08-08 DIAGNOSIS — E871 Hypo-osmolality and hyponatremia: Secondary | ICD-10-CM

## 2014-08-08 DIAGNOSIS — I10 Essential (primary) hypertension: Secondary | ICD-10-CM

## 2014-08-08 DIAGNOSIS — F1010 Alcohol abuse, uncomplicated: Secondary | ICD-10-CM

## 2014-08-08 DIAGNOSIS — J9601 Acute respiratory failure with hypoxia: Secondary | ICD-10-CM

## 2014-08-08 NOTE — Progress Notes (Signed)
PATIENT: Jeffrey Humphrey DOB: 04/20/1956  HISTORICAL  Abdalla Bayles 58  Yo RH male is referred by his primary care physician Dr. Tracey Harriesavid Bouska to followup his most recent hospital admission for seizure  He has been a lot of stress recently, also had a long-standing history of alcohol use.  In September fifteenth 2015, he was found by his coworker, that he is confused, thought he was drunk, was sent to urgent care, reported negative UDS, and alcohol levels,  In July 12 2014, he had a witnessed seizure by his family, was [facility] to [facility], sodium level was 1 1 4, he was diagnosed with hyponatremia induced a seizure activity, likely due to his long-term alcohol use, most recent repeat sodium was 128 in September 20 eighth, CAT scan of the brain showed no significant abnormality, EEG showed generalized slowing,  He denied previous history of seizure, he does has protruded abdomen, but denied abdominal pain, he denied focal signs   REVIEW OF SYSTEMS: Full 14 system review of systems performed and notable only for as above  ALLERGIES: Allergies  Allergen Reactions  . Ace Inhibitors Other (See Comments)    Cough    HOME MEDICATIONS: Current Outpatient Prescriptions on File Prior to Visit  Medication Sig Dispense Refill  . ALPRAZolam (XANAX) 0.5 MG tablet Take 0.5 mg by mouth 2 (two) times daily as needed for anxiety.       Marland Kitchen. diltiazem (CARDIZEM) 60 MG tablet Take 1 tablet (60 mg total) by mouth every 12 (twelve) hours.  60 tablet  0  . diphenhydrAMINE (BENADRYL) 25 MG tablet Take 25 mg by mouth at bedtime as needed.       . folic acid (FOLVITE) 1 MG tablet Take 1 tablet (1 mg total) by mouth daily.  30 tablet  0  . Ginkgo Biloba 60 MG TABS Take 60 mg by mouth daily.      Marland Kitchen. ibuprofen (ADVIL,MOTRIN) 200 MG tablet Take 200 mg by mouth every 6 (six) hours as needed for moderate pain.      Marland Kitchen. losartan (COZAAR) 100 MG tablet Take 100 mg by mouth daily.      . Magnesium 250 MG TABS  Take 250 mg by mouth daily.      . milk thistle 175 MG tablet Take 175 mg by mouth daily.      . naproxen sodium (ANAPROX) 220 MG tablet Take 220 mg by mouth 2 (two) times daily as needed (for pain).       . nebivolol (BYSTOLIC) 10 MG tablet Take 10 mg by mouth daily.      Marland Kitchen. omeprazole (PRILOSEC OTC) 20 MG tablet Take 20 mg by mouth daily.      Marland Kitchen. PARoxetine (PAXIL) 20 MG tablet Take 20 mg by mouth daily.      Marland Kitchen. thiamine 100 MG tablet Take 1 tablet (100 mg total) by mouth daily.  30 tablet  0  . Zinc 50 MG TABS Take 50 mg by mouth daily.       No current facility-administered medications on file prior to visit.    PAST MEDICAL HISTORY: Past Medical History  Diagnosis Date  . HTN (hypertension)   . Depression   . GERD (gastroesophageal reflux disease)   . ETOH abuse     PAST SURGICAL HISTORY: Past Surgical History  Procedure Laterality Date  . Hernia repair  1993    FAMILY HISTORY: Family History  Problem Relation Age of Onset  . Cancer Mother  SOCIAL HISTORY:  History   Social History  . Marital Status: Unknown    Spouse Name: N/A    Number of Children: 0  . Years of Education: 12   Occupational History  . 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    Social History Main Topics  . Smoking status: Former Smoker -- 2.00 packs/day for 14 years    Types: Cigarettes    Quit date: 08/13/2006  . Smokeless tobacco: Never Used  . Alcohol Use: 42.0 oz/week    70 Cans of beer per week     Comment: quit Sept 20156  . Drug Use: No  . Sexual Activity: Not on file   Other Topics Concern  . Not on file   Social History Narrative  . No narrative on file     PHYSICAL EXAM   Filed Vitals:   08/08/14 1013  BP: 90/57  Pulse: 57  Height: 5\' 10"  (1.778 m)  Weight: 235 lb (106.595 kg)    Not recorded    Body mass index is 33.72 kg/(m^2).   Generalized: In no acute distress  Neck: Supple, no carotid bruits   Cardiac: Regular rate rhythm  Pulmonary: Clear  to auscultation bilaterally  Musculoskeletal: No deformity  Neurological examination  Mentation: Alert oriented to time, place, history taking, and causual conversation, obesity  Cranial nerve II-XII: Pupils were equal round reactive to light. Extraocular movements were full.  Visual field were full on confrontational test. Bilateral fundi were sharp.  Facial sensation and strength were normal. Hearing was intact to finger rubbing bilaterally. Uvula tongue midline.  Head turning and shoulder shrug and were normal and symmetric.Tongue protrusion into cheek strength was normal.  Motor: Normal tone, bulk and strength.  Sensory: Intact to fine touch, pinprick, preserved vibratory sensation, and proprioception at toes.  Coordination: Normal finger to nose, heel-to-shin bilaterally there was no truncal ataxia  Gait: Rising up from seated position without assistance, normal stance, without trunk ataxia, moderate stride, good arm swing, smooth turning, able to perform tiptoe, and heel walking without difficulty.   Romberg signs: Negative  Deep tendon reflexes: Brachioradialis 2/2, biceps 2/2, triceps 2/2, patellar 2/2, Achilles 2/2, plantar responses were flexor bilaterally.   DIAGNOSTIC DATA (LABS, IMAGING, TESTING) - I reviewed patient records, labs, notes, testing and imaging myself where available.  Lab Results  Component Value Date   WBC 10.0 07/18/2014   HGB 11.9* 07/18/2014   HCT 34.0* 07/18/2014   MCV 95.0 07/18/2014   PLT 359 07/18/2014      Component Value Date/Time   NA 128* 07/18/2014 0625   K 3.7 07/18/2014 0625   CL 87* 07/18/2014 0625   CO2 27 07/18/2014 0625   GLUCOSE 116* 07/18/2014 0625   BUN 21 07/18/2014 0625   CREATININE 0.79 07/18/2014 0625   CALCIUM 10.0 07/18/2014 0625   PROT 7.0 07/18/2014 0625   ALBUMIN 3.6 07/18/2014 0625   AST 54* 07/18/2014 0625   ALT 64* 07/18/2014 0625   ALKPHOS 97 07/18/2014 0625   BILITOT 0.5 07/18/2014 0625   GFRNONAA >90 07/18/2014 0625    GFRAA >90 07/18/2014 0625   Lab Results  Component Value Date   TRIG 77 07/12/2014   No results found for this basename: HGBA1C   Lab Results  Component Value Date   VITAMINB12 939* 07/17/2014   Lab Results  Component Value Date   TSH 2.750 07/15/2014      ASSESSMENT AND PLAN  Jeffrey Humphrey is a 58 y.o. male had  mental confusion, 1 generalized seizure in the setting of hyponatremia, sodium 1 1 4, likely due to his long-term alcohol abuse, now he has quit drinking since July 11 2014,  1, complete evaluation with MRI of the brain with and without contrast 2. Repeat EEG 3, repeat sodium level 4. No driving until he came back to followup result in November fifths 2015 5, if all the workup is negative, Na normalize, his seizure most likely due to hyponatremia, I  will not put limitations on his driving,  Levert Feinstein, M.D. Ph.D.  [facility] [address], [address] [place], [place] [address] [phone]

## 2014-08-09 ENCOUNTER — Telehealth: Payer: Self-pay | Admitting: Neurology

## 2014-08-09 LAB — COMPREHENSIVE METABOLIC PANEL
A/G RATIO: 1.7 (ref 1.1–2.5)
ALT: 27 IU/L (ref 0–44)
AST: 21 IU/L (ref 0–40)
Albumin: 4.3 g/dL (ref 3.5–5.5)
Alkaline Phosphatase: 91 IU/L (ref 39–117)
BUN/Creatinine Ratio: 23 — ABNORMAL HIGH (ref 9–20)
BUN: 32 mg/dL — ABNORMAL HIGH (ref 6–24)
CHLORIDE: 97 mmol/L (ref 96–108)
CO2: 25 mmol/L (ref 18–29)
Calcium: 9.8 mg/dL (ref 8.7–10.2)
Creatinine, Ser: 1.42 mg/dL — ABNORMAL HIGH (ref 0.76–1.27)
GFR calc Af Amer: 63 mL/min/{1.73_m2} (ref >59)
GFR, EST NON AFRICAN AMERICAN: 55 mL/min/{1.73_m2} — AB (ref >59)
GLUCOSE: 92 mg/dL (ref 65–99)
Globulin, Total: 2.5 g/dL (ref 1.5–4.5)
POTASSIUM: 4.7 mmol/L (ref 3.5–5.2)
SODIUM: 134 mmol/L (ref 134–144)
TOTAL PROTEIN: 6.8 g/dL (ref 6.0–8.5)
Total Bilirubin: 0.4 mg/dL (ref 0.0–1.2)

## 2014-08-09 NOTE — Telephone Encounter (Signed)
I have called patient, left message, sodium level is within normal limits now, I will fax the results to his primary care physician Dr. Tracey Harriesavid Bouska, repeat laboratory showed elevated creatinine 1.4 9

## 2014-08-20 ENCOUNTER — Telehealth: Payer: Self-pay

## 2014-08-20 DIAGNOSIS — R569 Unspecified convulsions: Secondary | ICD-10-CM

## 2014-08-20 NOTE — Telephone Encounter (Signed)
Please re order EEG to adult order. Not Ambulatory.

## 2014-08-21 ENCOUNTER — Telehealth: Payer: Self-pay | Admitting: Neurology

## 2014-08-21 NOTE — Telephone Encounter (Signed)
Called patient about scheduling EEG per Dr. Terrace ArabiaYan, patient wanted to know about insurance coverage so I transferred patient to billing. Patient declined to schedule.

## 2014-08-29 ENCOUNTER — Encounter: Payer: Self-pay | Admitting: Neurology

## 2014-08-29 ENCOUNTER — Ambulatory Visit (INDEPENDENT_AMBULATORY_CARE_PROVIDER_SITE_OTHER): Payer: 59 | Admitting: Neurology

## 2014-08-29 VITALS — BP 113/72 | HR 64 | Ht 70.0 in | Wt 236.0 lb

## 2014-08-29 DIAGNOSIS — E871 Hypo-osmolality and hyponatremia: Secondary | ICD-10-CM

## 2014-08-29 DIAGNOSIS — R569 Unspecified convulsions: Secondary | ICD-10-CM

## 2014-08-29 NOTE — Progress Notes (Signed)
PATIENT: Jeffrey Humphrey DOB: 05/29/1956  HISTORICAL  Michall Kunesh 58  Yo RH male is referred by his primary care physician Dr. Tracey Harriesavid Bouska to followup his most recent hospital admission for seizure  He has been a lot of stress recently, also had a long-standing history of alcohol use.  In September 15th 2015, he was found by his coworker, that he is confused, thought he was drunk, was sent to urgent care, reported negative UDS, and alcohol levels,  In July 12 2014, he had a witnessed seizure by his family, was [facility], sodium level was 1 1 4, he was diagnosed with hyponatremia induced a seizure activity, likely due to his long-term alcohol use, most recent repeat sodium was 128 in September 28th, CAT scan of the brain showed no significant abnormality, EEG showed generalized slowing,  He denied previous history of seizure, he does has protruded abdomen, but denied abdominal pain, he denied focal signs.  UPDATE Nov 5th 2015: He is doing very well, there was no recurrent seizure, repeat sodium level was 137. He denies gait difficulty. He did not have MRI of the brain, or  EEG, because of financial concerns,   REVIEW OF SYSTEMS: Full 14 system review of systems performed and notable only for as above  ALLERGIES: Allergies  Allergen Reactions  . Ace Inhibitors Other (See Comments)    Cough    HOME MEDICATIONS: Current Outpatient Prescriptions on File Prior to Visit  Medication Sig Dispense Refill  . ALPRAZolam (XANAX) 0.5 MG tablet Take 0.5 mg by mouth 2 (two) times daily as needed for anxiety.     . diphenhydrAMINE (BENADRYL) 25 MG tablet Take 25 mg by mouth at bedtime as needed.     Marland Kitchen. ibuprofen (ADVIL,MOTRIN) 200 MG tablet Take 200 mg by mouth every 6 (six) hours as needed for moderate pain.    Marland Kitchen. losartan (COZAAR) 100 MG tablet Take 100 mg by mouth daily.    . Magnesium 250 MG TABS Take 250 mg by mouth daily.    . nebivolol (BYSTOLIC) 10 MG tablet Take 10  mg by mouth daily.    Marland Kitchen. omeprazole (PRILOSEC OTC) 20 MG tablet Take 20 mg by mouth daily.    Marland Kitchen. thiamine 100 MG tablet Take 1 tablet (100 mg total) by mouth daily. 30 tablet 0  . Zinc 50 MG TABS Take 50 mg by mouth daily.     No current facility-administered medications on file prior to visit.    PAST MEDICAL HISTORY: Past Medical History  Diagnosis Date  . HTN (hypertension)   . Depression   . GERD (gastroesophageal reflux disease)   . ETOH abuse     PAST SURGICAL HISTORY: Past Surgical History  Procedure Laterality Date  . Hernia repair  1993    FAMILY HISTORY: Family History  Problem Relation Age of Onset  . Cancer Mother   . Prostate cancer Father     SOCIAL HISTORY:  History   Social History  . Marital Status: Unknown    Spouse Name: N/A    Number of Children: 0  . Years of Education: 12   Occupational History  . 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    Social History Main Topics  . Smoking status: Former Smoker -- 2.00 packs/day for 14 years    Types: Cigarettes    Quit date: 08/13/2006  . Smokeless tobacco: Never Used  . Alcohol Use: 42.0 oz/week    70 Cans of beer per  week     Comment: quit Sept 20156  . Drug Use: No  . Sexual Activity: Not on file   Other Topics Concern  . Not on file   Social History Narrative  . No narrative on file     PHYSICAL EXAM   Filed Vitals:   08/29/14 1018  BP: 113/72  Pulse: 64  Height: 5\' 10"  (1.778 m)  Weight: 236 lb (107.049 kg)    Not recorded      Body mass index is 33.86 kg/(m^2).   Generalized: In no acute distress  Neck: Supple, no carotid bruits   Cardiac: Regular rate rhythm  Pulmonary: Clear to auscultation bilaterally  Musculoskeletal: No deformity  Neurological examination  Mentation: Alert oriented to time, place, history taking, and causual conversation, obesity  Cranial nerve II-XII: Pupils were equal round reactive to light. Extraocular movements were full.  Visual  field were full on confrontational test. Bilateral fundi were sharp.  Facial sensation and strength were normal. Hearing was intact to finger rubbing bilaterally. Uvula tongue midline.  Head turning and shoulder shrug and were normal and symmetric.Tongue protrusion into cheek strength was normal.  Motor: Normal tone, bulk and strength.  Sensory: Intact to fine touch, pinprick, preserved vibratory sensation, and proprioception at toes.  Coordination: Normal finger to nose, heel-to-shin bilaterally there was no truncal ataxia  Gait: Rising up from seated position without assistance, normal stance, without trunk ataxia, moderate stride, good arm swing, smooth turning, able to perform tiptoe, and heel walking without difficulty.   Romberg signs: Negative  Deep tendon reflexes: Brachioradialis 2/2, biceps 2/2, triceps 2/2, patellar 3/3, Achilles 2/2, plantar responses were flexor bilaterally.   DIAGNOSTIC DATA (LABS, IMAGING, TESTING) - I reviewed patient records, labs, notes, testing and imaging myself where available.  Lab Results  Component Value Date   WBC 10.0 07/18/2014   HGB 11.9* 07/18/2014   HCT 34.0* 07/18/2014   MCV 95.0 07/18/2014   PLT 359 07/18/2014      Component Value Date/Time   NA 134 08/08/2014 1121   NA 128* 07/18/2014 0625   K 4.7 08/08/2014 1121   CL 97 08/08/2014 1121   CO2 25 08/08/2014 1121   GLUCOSE 92 08/08/2014 1121   GLUCOSE 116* 07/18/2014 0625   BUN 32* 08/08/2014 1121   BUN 21 07/18/2014 0625   CREATININE 1.42* 08/08/2014 1121   CALCIUM 9.8 08/08/2014 1121   PROT 6.8 08/08/2014 1121   PROT 7.0 07/18/2014 0625   ALBUMIN 3.6 07/18/2014 0625   AST 21 08/08/2014 1121   ALT 27 08/08/2014 1121   ALKPHOS 91 08/08/2014 1121   BILITOT 0.4 08/08/2014 1121   GFRNONAA 55* 08/08/2014 1121   GFRAA 63 08/08/2014 1121   Lab Results  Component Value Date   TRIG 77 07/12/2014   No results found for: HGBA1C Lab Results  Component Value Date   VITAMINB12  939* 07/17/2014   Lab Results  Component Value Date   TSH 2.750 07/15/2014      ASSESSMENT AND PLAN  Ashely Borawski is a 58 y.o. male had mental confusion, 1 generalized seizure July 11 2014, in the setting of hyponatremia, sodium 114, and abrupt stop drinking, now  his Na is 137 on most recent evaluation, he is doing well, no recurrent seizure,   No driving till seizure-free for 6 months, we have spent length of time discuss [place] for patient with seizure Return to clinic as needed   Levert FeinsteinYijun Maite Burlison, M.D. Ph.D.  [facility] [address] [place], [place] [address] [phone]

## 2014-09-02 ENCOUNTER — Telehealth: Payer: Self-pay | Admitting: Neurology

## 2014-09-02 NOTE — Telephone Encounter (Signed)
Seizure, R 56.9

## 2014-12-04 ENCOUNTER — Telehealth: Payer: Self-pay | Admitting: Neurology

## 2014-12-04 NOTE — Telephone Encounter (Signed)
Would you like to provide this letter without a diagnosis?

## 2014-12-04 NOTE — Telephone Encounter (Signed)
Patient requesting an e mail a letter to his employer stating he's not able to drive at this time.  He's requesting not to disclose his diagnosis.  Please e mail: [email or web address].  Please call and advise.

## 2014-12-05 NOTE — Telephone Encounter (Signed)
I have generated the letter,please printout, we will not email his employer year, we can fax over the letter.

## 2014-12-05 NOTE — Telephone Encounter (Signed)
Pt is calling back asking that we fax the letter to his employer.  The fax number is [phone].

## 2014-12-05 NOTE — Telephone Encounter (Signed)
Letter faxed to employer at number below.  Patient aware.

## 2015-01-06 ENCOUNTER — Telehealth: Payer: Self-pay | Admitting: *Deleted

## 2015-01-06 NOTE — Telephone Encounter (Signed)
Seizure caused by low sodium - one seizure and no further events since resolved.  Says he realized, while he has been out, that his job is extremely stressful and would like a note to be out longer.  I informed him that because there is no medical reason for him not to return that we would not be able to provide the note (verified this with Dr. Terrace ArabiaYan).  He verbalized understanding.

## 2015-01-06 NOTE — Telephone Encounter (Signed)
Patient is calling wanting help for work. Patient would like to be out a little longer. Please call

## 2016-05-19 IMAGING — CR DG CHEST 1V PORT
1 series · 1 of 1 positions shown · non-contrast
Comparison: Chest radiograph performed earlier today at [DATE] a.m.

CLINICAL DATA: Endotracheal tube placement.

EXAM:
PORTABLE CHEST - 1 VIEW

[AP]
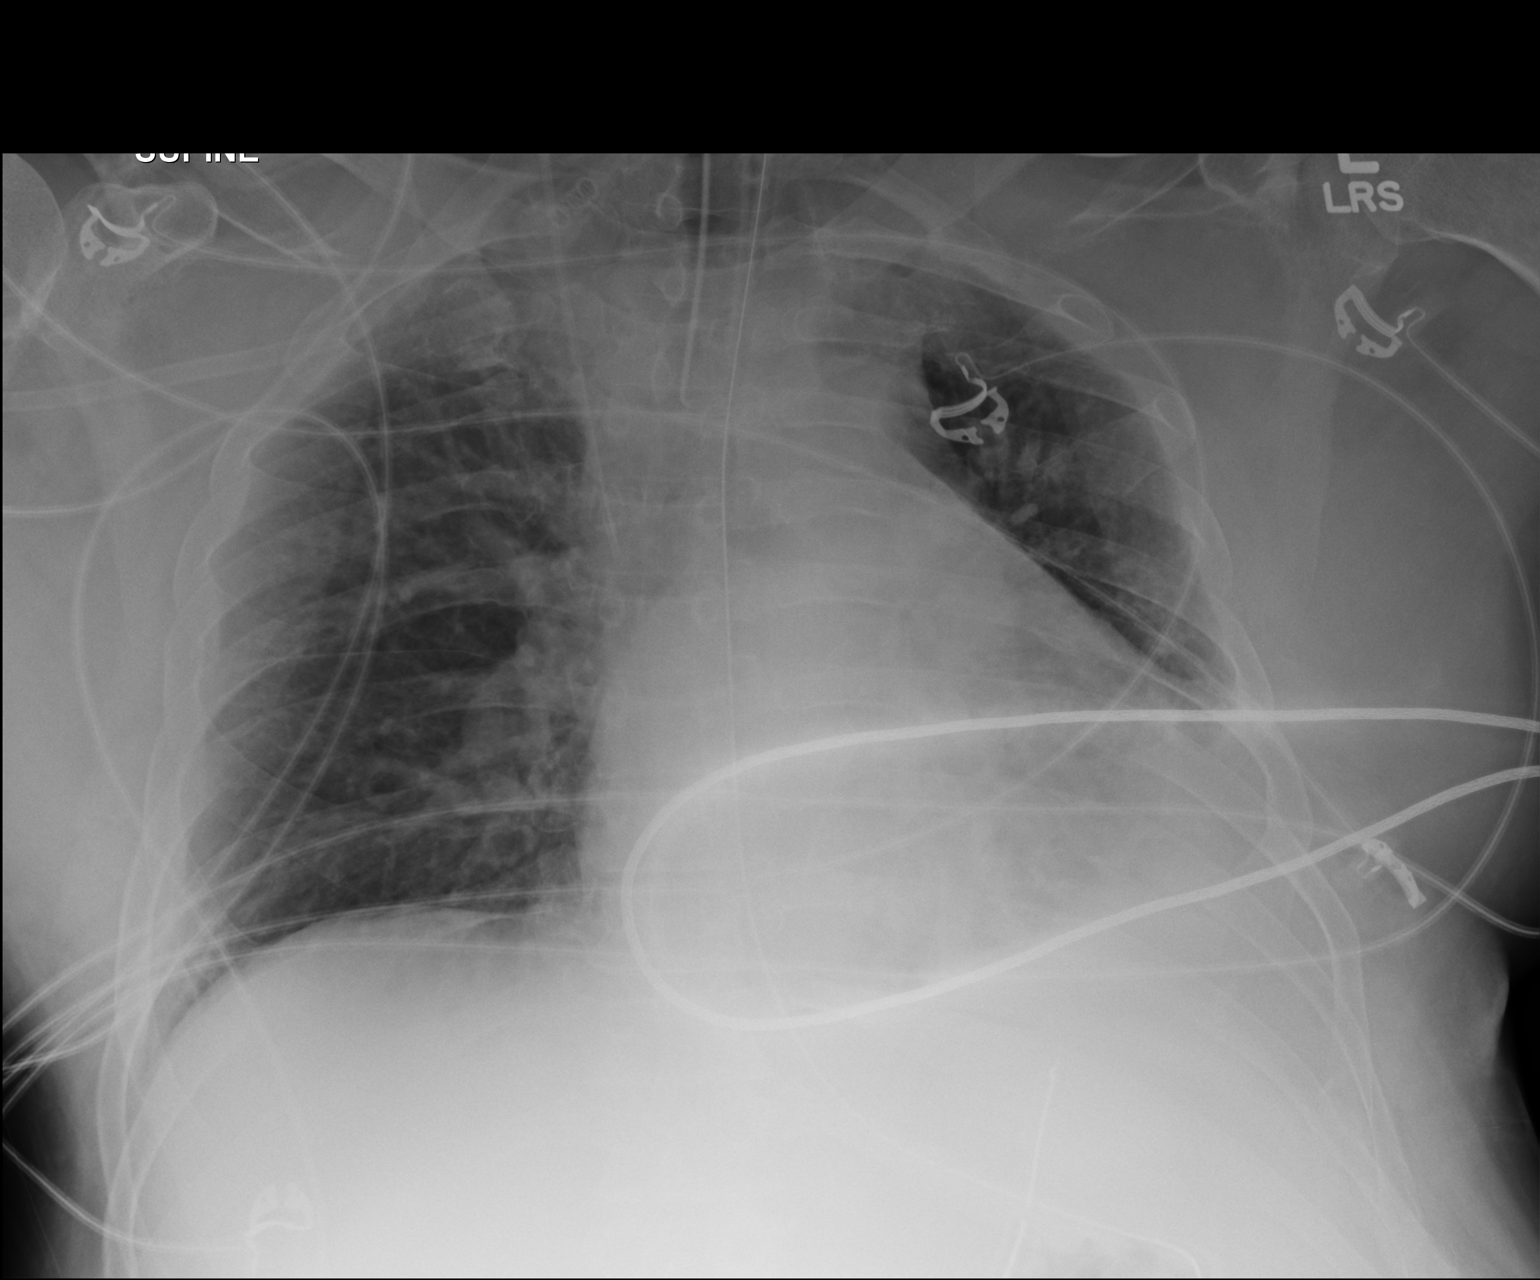

[1 of 1 positions shown; findings below may reference images not displayed]

FINDINGS: The patient's endotracheal tube is seen ending 2-3 cm above the
carina. An enteric tube noted extending below the diaphragm. A right
IJ line is noted ending about the mid SVC.

The lungs are hypoexpanded. Left basilar airspace opacity may
reflect atelectasis or pneumonia. Vascular congestion and vascular
crowding are seen. A small left pleural effusion is suspected. No
pneumothorax is seen.

The cardiomediastinal silhouette is borderline enlarged. No acute
osseous abnormalities are identified.
IMPRESSION: 1. Endotracheal tube seen ending 2-3 cm above the carina.
2. Lungs hypoexpanded. Left basilar airspace opacity may reflect
atelectasis or pneumonia.
3. Vascular congestion and borderline cardiomegaly. Small left
pleural effusion suspected.

## 2016-05-19 IMAGING — CT CT HEAD W/O CM
3 of 6 series · 14 of 47 positions shown, 16 images · non-contrast
Comparison: None.

CLINICAL DATA: Altered mental status, seizure

EXAM:
CT HEAD WITHOUT CONTRAST
CT CERVICAL SPINE WITHOUT CONTRAST
TECHNIQUE: Multidetector CT imaging of the head and cervical spine was
performed following the standard protocol without intravenous
contrast. Multiplanar CT image reconstructions of the cervical spine
were also generated.

[Series 7: coronals · coronal · 0.23mm/px · 3 of 68 slices shown]
[im 23/68  brain]
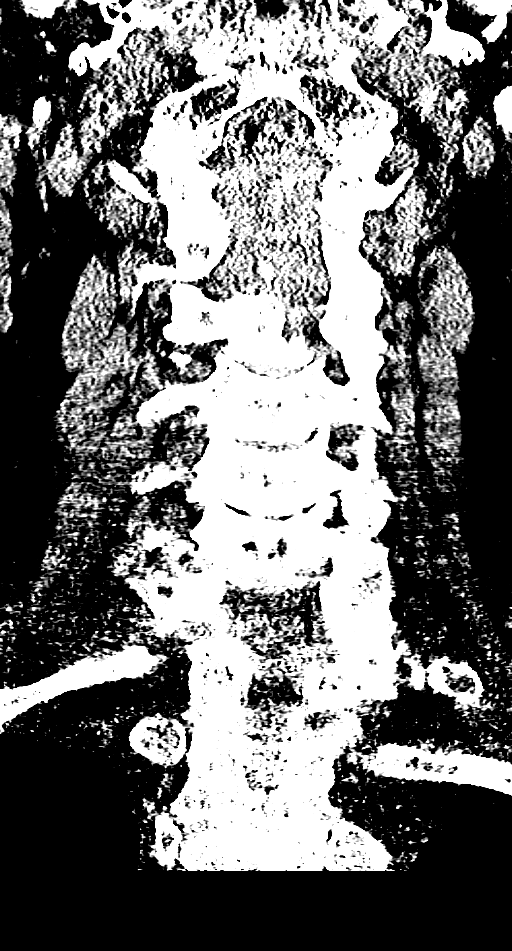
[im 30/68  brain]
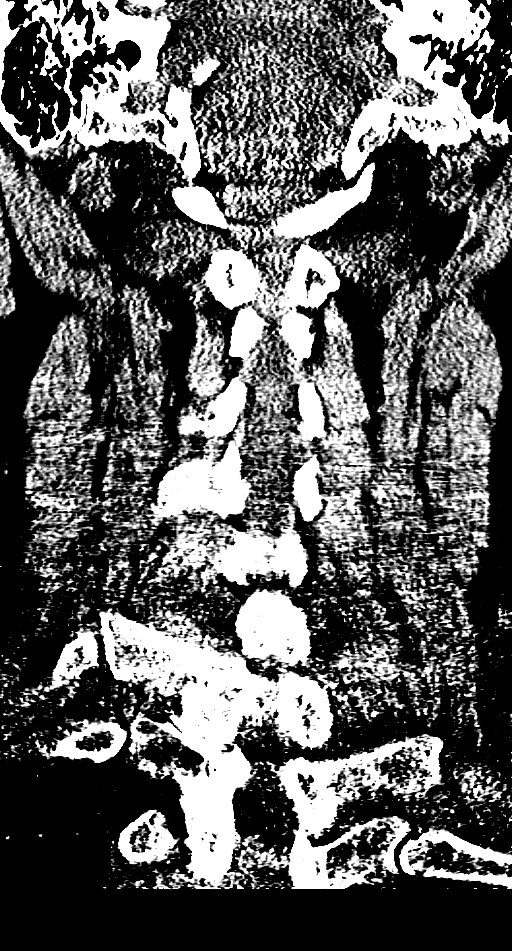
[im 38/68  brain]
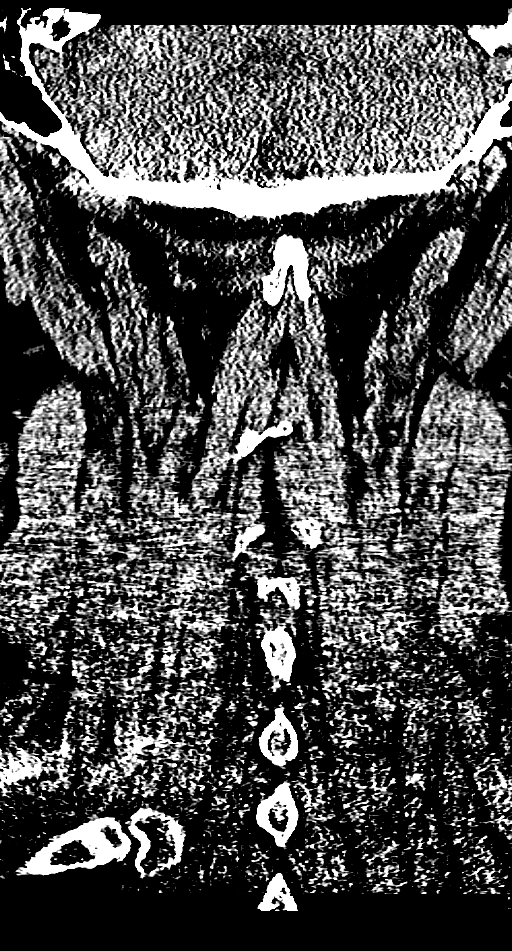

[Series 8: sagittals · sagittal · 0.34mm/px · 3 of 59 slices shown]
[im 20/59  brain]
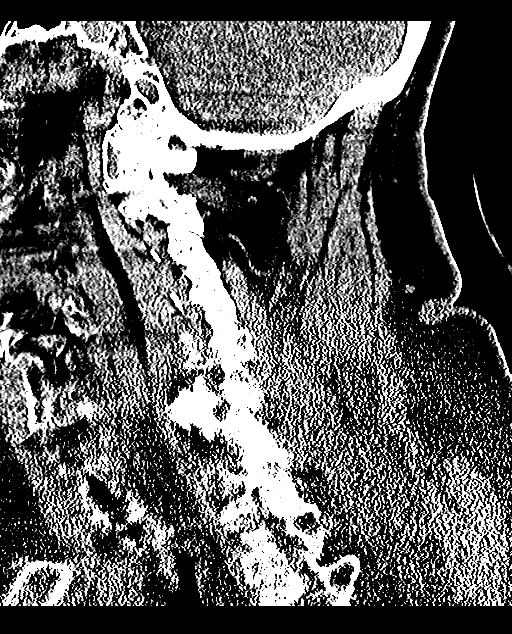
[im 30/59  brain]
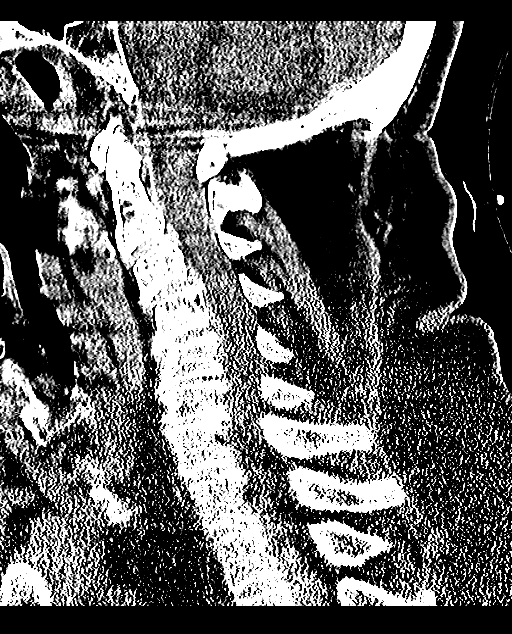
[im 39/59  brain]
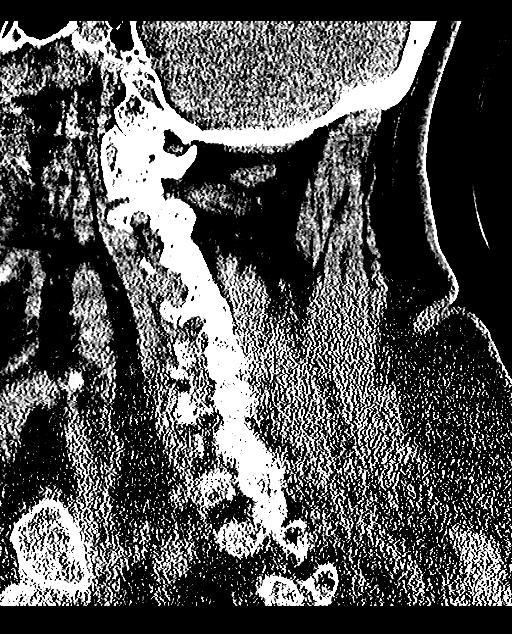

[Series 9: orthogonals · axial · 0.23mm/px · z∈[-284,-137]mm · 8 of 96 slices shown, 10 images]
[im 10/96  brain]
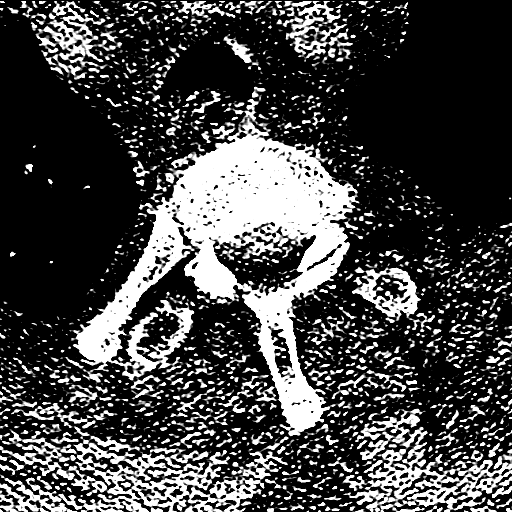
[im 10/96  bone]
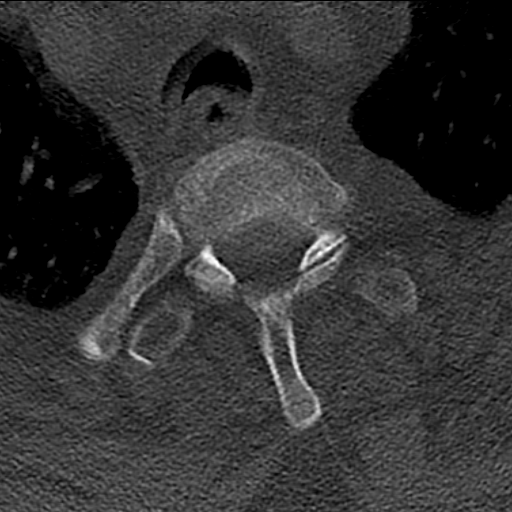
[im 20/96  brain]
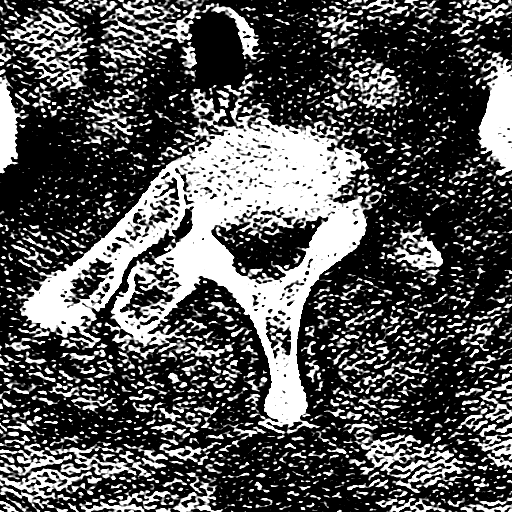
[im 29/96  brain]
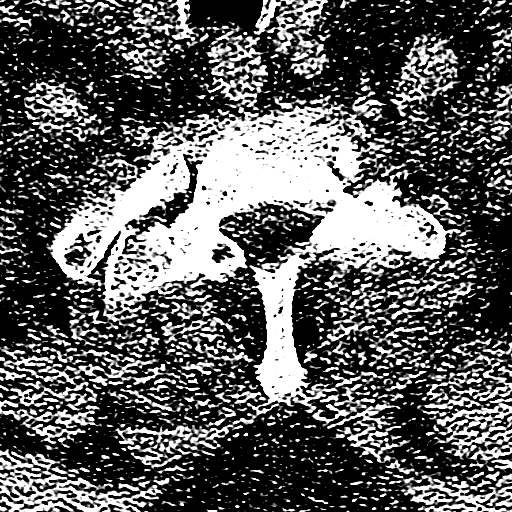
[im 39/96  brain]
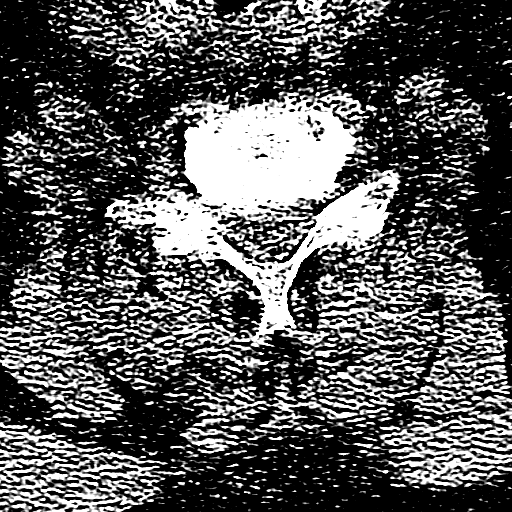
[im 58/96  brain]
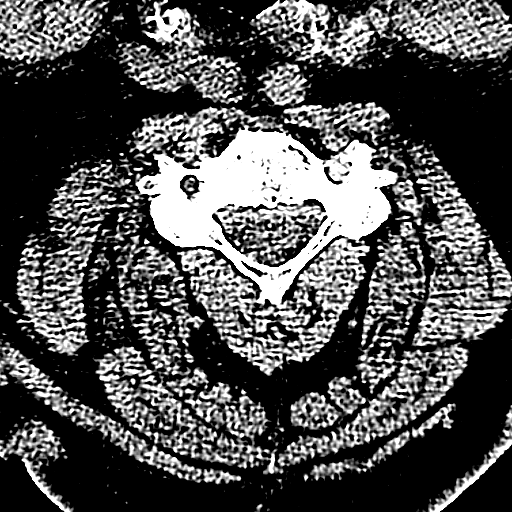
[im 58/96  bone]
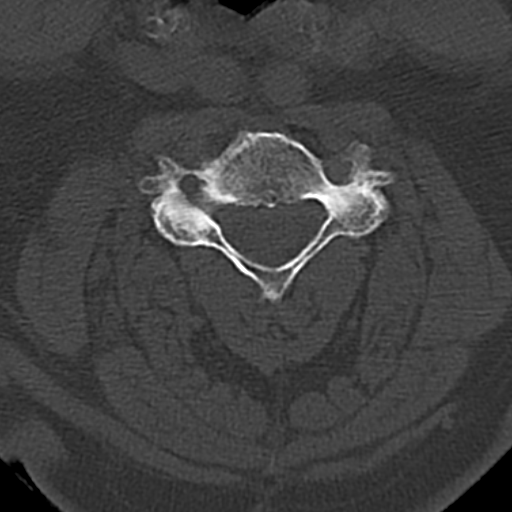
[im 67/96  brain]
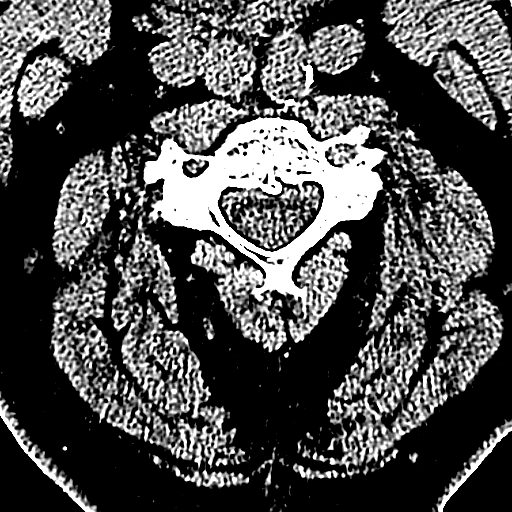
[im 77/96  brain]
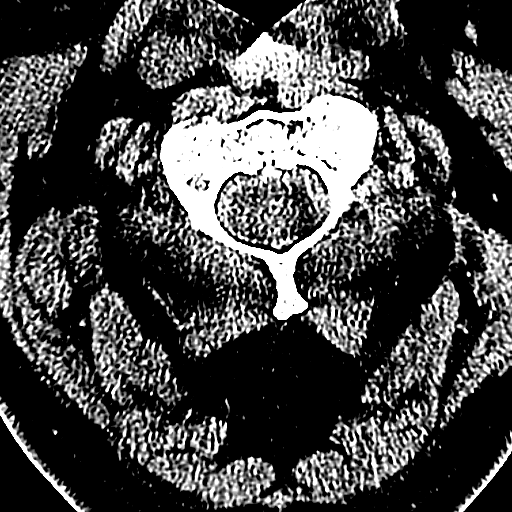
[im 86/96  brain]
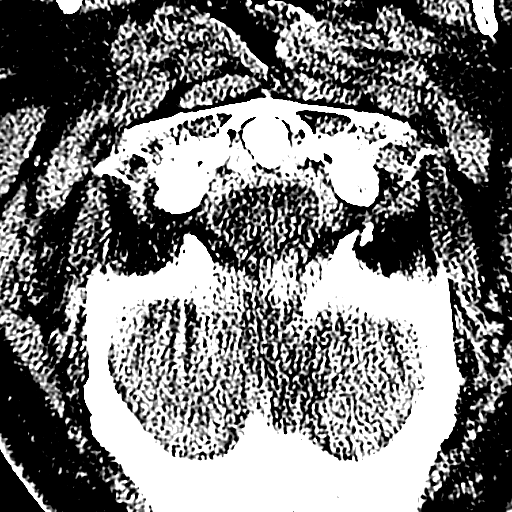

[14 of 47 positions shown; findings below may reference images not displayed]

FINDINGS: CT HEAD FINDINGS

There is no acute intracranial hemorrhage or infarct. No mass lesion
or midline shift. Gray-white matter differentiation is well
maintained. Ventricles are normal in size without evidence of
hydrocephalus. CSF containing spaces are within normal limits. No
extra-axial fluid collection.

The calvarium is intact.

Orbital soft tissues are within normal limits.

The paranasal sinuses and mastoid air cells are well pneumatized and
free of fluid.

Scalp soft tissues are unremarkable.

CT CERVICAL SPINE FINDINGS

Study is degraded by motion artifact.

There is reversal of the normal cervical lordosis with apex at C4-5.
No listhesis. Vertebral body heights are preserved. Normal C1-2
articulations are intact. There is fullness of the prevertebral soft
tissues with the internal carotid arteries medialized into the
retropharyngeal space. No retropharyngeal hematoma or other
abnormality. No acute fracture or listhesis.

Advanced multilevel degenerative disc disease is evidenced by
intervertebral disc space narrowing, endplate sclerosis, and
osteophytosis is seen throughout the cervical spine, most evident at
C4-5, C5-6, and C6-7.

Visualized soft tissues of the neck are within normal limits.
Visualized lung apices are clear without evidence of apical
pneumothorax.
IMPRESSION: CT BRAIN:

No acute intracranial process.

CT CERVICAL SPINE:

1. Motion degraded study with no definite acute traumatic injury
within the cervical spine.
2. Reversal of the normal cervical lordosis with moderate multilevel
degenerative disc disease, most severe at C4-5 through C6-7.

## 2018-10-27 ENCOUNTER — Emergency Department (HOSPITAL_COMMUNITY)
Admission: EM | Admit: 2018-10-27 | Discharge: 2018-10-28 | Disposition: A | Discharge status: Home / Self Care | Payer: 59 | Attending: Emergency Medicine | Admitting: Emergency Medicine

## 2018-10-27 ENCOUNTER — Encounter (HOSPITAL_COMMUNITY): Payer: Self-pay | Admitting: Emergency Medicine

## 2018-10-27 DIAGNOSIS — Z87891 Personal history of nicotine dependence: Secondary | ICD-10-CM | POA: Insufficient documentation

## 2018-10-27 DIAGNOSIS — F19159 Other psychoactive substance abuse with psychoactive substance-induced psychotic disorder, unspecified: Secondary | ICD-10-CM | POA: Insufficient documentation

## 2018-10-27 DIAGNOSIS — Z79899 Other long term (current) drug therapy: Secondary | ICD-10-CM | POA: Diagnosis not present

## 2018-10-27 DIAGNOSIS — F1020 Alcohol dependence, uncomplicated: Secondary | ICD-10-CM | POA: Insufficient documentation

## 2018-10-27 DIAGNOSIS — I10 Essential (primary) hypertension: Secondary | ICD-10-CM | POA: Insufficient documentation

## 2018-10-27 DIAGNOSIS — Z046 Encounter for general psychiatric examination, requested by authority: Secondary | ICD-10-CM | POA: Diagnosis present

## 2018-10-27 DIAGNOSIS — F29 Unspecified psychosis not due to a substance or known physiological condition: Secondary | ICD-10-CM

## 2018-10-27 DIAGNOSIS — E871 Hypo-osmolality and hyponatremia: Secondary | ICD-10-CM | POA: Diagnosis not present

## 2018-10-27 LAB — CBC
HCT: 41.6 % (ref 39.0–52.0)
Hemoglobin: 14.2 g/dL (ref 13.0–17.0)
MCH: 35 pg — ABNORMAL HIGH (ref 26.0–34.0)
MCHC: 34.1 g/dL (ref 30.0–36.0)
MCV: 102.5 fL — ABNORMAL HIGH (ref 80.0–100.0)
PLATELETS: 226 10*3/uL (ref 150–400)
RBC: 4.06 MIL/uL — ABNORMAL LOW (ref 4.22–5.81)
RDW: 13.2 % (ref 11.5–15.5)
WBC: 12.8 10*3/uL — ABNORMAL HIGH (ref 4.0–10.5)
nRBC: 0 % (ref 0.0–0.2)

## 2018-10-27 LAB — BLOOD GAS, VENOUS
Acid-Base Excess: 2.9 mmol/L — ABNORMAL HIGH (ref 0.0–2.0)
Bicarbonate: 28.5 mmol/L — ABNORMAL HIGH (ref 20.0–28.0)
O2 Saturation: 81.5 %
Patient temperature: 98.6
pCO2, Ven: 46.6 mmHg (ref 44.0–60.0)
pH, Ven: 7.403 (ref 7.250–7.430)
pO2, Ven: 49.4 mmHg — ABNORMAL HIGH (ref 32.0–45.0)

## 2018-10-27 LAB — COMPREHENSIVE METABOLIC PANEL
ALBUMIN: 4.6 g/dL (ref 3.5–5.0)
ALK PHOS: 71 U/L (ref 38–126)
ALT: 47 U/L — ABNORMAL HIGH (ref 0–44)
AST: 55 U/L — AB (ref 15–41)
Anion gap: 16 — ABNORMAL HIGH (ref 5–15)
BILIRUBIN TOTAL: 1.6 mg/dL — AB (ref 0.3–1.2)
BUN: 17 mg/dL (ref 6–20)
CO2: 23 mmol/L (ref 22–32)
Calcium: 9.5 mg/dL (ref 8.9–10.3)
Chloride: 87 mmol/L — ABNORMAL LOW (ref 98–111)
Creatinine, Ser: 1.14 mg/dL (ref 0.61–1.24)
GFR calc Af Amer: >60 mL/min (ref >60)
GLUCOSE: 99 mg/dL (ref 70–99)
Potassium: 4 mmol/L (ref 3.5–5.1)
Sodium: 126 mmol/L — ABNORMAL LOW (ref 135–145)
TOTAL PROTEIN: 7.5 g/dL (ref 6.5–8.1)

## 2018-10-27 LAB — ETHANOL

## 2018-10-27 MED ORDER — LORAZEPAM 2 MG/ML IJ SOLN
2.0000 mg | Freq: Once | INTRAMUSCULAR | Status: AC | PRN
  Administered 2018-10-27: 2 mg via INTRAMUSCULAR
  Filled 2018-10-27: qty 1

## 2018-10-27 MED ORDER — NICOTINE 21 MG/24HR TD PT24: 21.0000 mg | MEDICATED_PATCH | Freq: Every day | TRANSDERMAL | Status: DC

## 2018-10-27 MED ORDER — STERILE WATER FOR INJECTION IJ SOLN
INTRAMUSCULAR | Status: AC
  Administered 2018-10-27: 16:00:00
  Filled 2018-10-27: qty 10

## 2018-10-27 MED ORDER — LOSARTAN POTASSIUM 50 MG PO TABS
100.0000 mg | ORAL_TABLET | Freq: Every day | ORAL | Status: DC
  Administered 2018-10-28: 100 mg via ORAL
  Filled 2018-10-27 (×2): qty 2

## 2018-10-27 MED ORDER — VITAMIN B-1 100 MG PO TABS: 100.0000 mg | ORAL_TABLET | Freq: Every day | ORAL | Status: DC

## 2018-10-27 MED ORDER — DIPHENHYDRAMINE HCL 50 MG/ML IJ SOLN
50.0000 mg | Freq: Once | INTRAMUSCULAR | Status: AC | PRN
  Administered 2018-10-27: 50 mg via INTRAMUSCULAR
  Filled 2018-10-27: qty 1

## 2018-10-27 MED ORDER — ACETAMINOPHEN 325 MG PO TABS
650.0000 mg | ORAL_TABLET | ORAL | Status: DC | PRN
  Administered 2018-10-28: 650 mg via ORAL
  Filled 2018-10-27: qty 2

## 2018-10-27 MED ORDER — OLANZAPINE 10 MG PO TBDP
10.0000 mg | ORAL_TABLET | Freq: Once | ORAL | Status: AC
  Administered 2018-10-27: 10 mg via ORAL
  Filled 2018-10-27: qty 1

## 2018-10-27 MED ORDER — ZOLPIDEM TARTRATE 5 MG PO TABS: 5.0000 mg | ORAL_TABLET | Freq: Every evening | ORAL | Status: DC | PRN

## 2018-10-27 MED ORDER — ZIPRASIDONE MESYLATE 20 MG IM SOLR
20.0000 mg | Freq: Once | INTRAMUSCULAR | Status: AC | PRN
  Administered 2018-10-27: 20 mg via INTRAMUSCULAR
  Filled 2018-10-27: qty 20

## 2018-10-27 MED ORDER — NEBIVOLOL HCL 10 MG PO TABS
10.0000 mg | ORAL_TABLET | Freq: Every day | ORAL | Status: DC
  Administered 2018-10-28: 10 mg via ORAL
  Filled 2018-10-27 (×2): qty 1

## 2018-10-27 NOTE — ED Notes (Signed)
Pt awake, increasingly agitated, yelling, screaming, bizarre, swinging, security present. Jacki Cones NP notified of pt behavior., awaiting orders.

## 2018-10-27 NOTE — ED Provider Notes (Signed)
[facility]-EMERGENCY DEPT Provider Note   CSN: [id number] Arrival date & time: 10/27/18  [id number]     History   Chief Complaint Chief Complaint  Patient presents with  . Aggressive Behavior  . IVC    HPI Jeffrey Humphrey is a 63 y.o. male.  HPI   [facility]  The petition was signed by a friend of hers, Jeffrey Humphrey.  She states that the patient has been drinking heavily and in the last 48 hours has not slept.  He has become delusional, hallucinating, talking about aliens.  He is not attending to his personal hygiene, has been angry and destroying property.  Today he urinated on the floor when he was with his friend.  When law enforcement arrived to his home, he became violent, follow-up with family required physical restraint and is handcuffed on arrival.  Patient freely speaks to me.  He states he is seeing aliens to talk to him and tell him that the world will be destroyed by a nuclear bomb and he will be the last surviving person.  He also is hearing music that is not being played.  There are no other known modifying factors.  Past Medical History:  Diagnosis Date  . Depression   . ETOH abuse   . GERD (gastroesophageal reflux disease)   . HTN (hypertension)     Patient Active Problem List   Diagnosis Date Noted  . Acute respiratory failure with hypoxia (HCC) 07/18/2014  . Seizures (HCC) 07/18/2014  . Acute encephalopathy 07/18/2014  . Hypokalemia 07/18/2014  . CAP (community acquired pneumonia) 07/18/2014  . Obstructive sleep apnea 07/18/2014  . Tobacco abuse 07/18/2014  . HTN (hypertension) 07/18/2014  . Atrial fibrillation with RVR (HCC) 07/18/2014  . Depression 07/18/2014  . GERD (gastroesophageal reflux disease) 07/18/2014  . Alcohol abuse 07/18/2014  . Hyponatremia 07/12/2014    Past Surgical History:  Procedure Laterality Date  . HERNIA REPAIR  1993        Home Medications     Prior to Admission medications   Medication Sig Start Date End Date Taking? Authorizing Provider  ALPRAZolam Prudy Feeler(XANAX) 0.5 MG tablet Take 0.5 mg by mouth 2 (two) times daily as needed for anxiety.     [provider]  diphenhydrAMINE (BENADRYL) 25 MG tablet Take 25 mg by mouth at bedtime as needed.     [provider]  ibuprofen (ADVIL,MOTRIN) 200 MG tablet Take 200 mg by mouth every 6 (six) hours as needed for moderate pain.    [provider]  losartan (COZAAR) 100 MG tablet Take 100 mg by mouth daily.    [provider]  Magnesium 250 MG TABS Take 250 mg by mouth daily.    [provider]  nebivolol (BYSTOLIC) 10 MG tablet Take 10 mg by mouth daily.    [provider]  omeprazole (PRILOSEC OTC) 20 MG tablet Take 20 mg by mouth daily.    [provider]  thiamine 100 MG tablet Take 1 tablet (100 mg total) by mouth daily. 07/18/14   Drema DallasWoods, Curtis J, MD  Zinc 50 MG TABS Take 50 mg by mouth daily.    [provider]    Family History Family History  Problem Relation Age of Onset  . Cancer Mother   . Prostate cancer Father     Social History Social History   Tobacco Use  . Smoking status: Former Smoker    Packs/day: 2.00  Years: 14.00    Pack years: 28.00    Types: Cigarettes    Last attempt to quit: 08/13/2006    Years since quitting: 12.2  . Smokeless tobacco: Never Used  Substance Use Topics  . Alcohol use: No    Alcohol/week: 70.0 standard drinks    Types: 70 Cans of beer per week    Comment: quit Sept 2015  . Drug use: No     Allergies   Ace inhibitors   Review of Systems Review of Systems  All other systems reviewed and are negative.    Physical Exam Updated Vital Signs BP (!) 155/95 (BP Location: Right Arm)   Pulse (!) 105   Temp 98.5 F (36.9 C) (Oral)   Resp 20   SpO2 97%   Physical Exam Vitals signs and nursing note reviewed.  Constitutional:      General: He is in acute  distress (Agitated).     Appearance: He is well-developed. He is obese. He is not ill-appearing or diaphoretic.     Comments: Unkempt  HENT:     Head: Normocephalic and atraumatic.     Right Ear: External ear normal.     Left Ear: External ear normal.     Nose: No congestion or rhinorrhea.  Eyes:     Conjunctiva/sclera: Conjunctivae normal.     Pupils: Pupils are equal, round, and reactive to light.  Neck:     Musculoskeletal: Normal range of motion and neck supple.     Trachea: Phonation normal.  Cardiovascular:     Rate and Rhythm: Normal rate.  Pulmonary:     Effort: Pulmonary effort is normal.  Musculoskeletal: Normal range of motion.     Comments: Superficial contusions and abrasions, upper and lower arms bilaterally.  Minor wrist abrasions associated with presence of handcuffs, currently on the patient.  Skin:    General: Skin is dry.     Coloration: Skin is not jaundiced.  Neurological:     Mental Status: He is alert.     Cranial Nerves: No cranial nerve deficit.     Motor: No abnormal muscle tone.     Coordination: Coordination normal.     Comments: No dysarthria or aphasia  Psychiatric:        Attention and Perception: He is inattentive. He perceives auditory and visual hallucinations.        Mood and Affect: Mood is anxious and elated. Mood is not depressed. Affect is inappropriate (Laughing). Affect is not blunt, flat or angry.        Speech: Speech is tangential.        Behavior: Behavior is agitated.        Thought Content: Thought content is paranoid and delusional.        Cognition and Memory: Cognition is impaired. Memory is impaired. He exhibits impaired recent memory.        Judgment: Judgment is impulsive and inappropriate.      ED Treatments / Results  Labs (all labs ordered are listed, but only abnormal results are displayed) Labs Reviewed  COMPREHENSIVE METABOLIC PANEL - Abnormal; Notable for the following components:      Result Value   Sodium 126  (*)    Chloride 87 (*)    AST 55 (*)    ALT 47 (*)    Total Bilirubin 1.6 (*)    Anion gap 16 (*)    All other components within normal limits  CBC - Abnormal; Notable for the following components:  WBC 12.8 (*)    RBC 4.06 (*)    MCV 102.5 (*)    MCH 35.0 (*)    All other components within normal limits  ETHANOL  RAPID URINE DRUG SCREEN, HOSP PERFORMED    TTS consultation  EKG EKG Interpretation  Date/Time:  Friday October 27 2018 11:42:55 EST Ventricular Rate:  102 PR Interval:  164 QRS Duration: 88 QT Interval:  350 QTC Calculation: 456 R Axis:   23 Text Interpretation:  Sinus tachycardia Septal infarct , age undetermined Abnormal ECG Since last tracing rate faster Confirmed by Mancel Bale [phone]) on 10/27/2018 2:09:48 PM   Radiology No results found.  Procedures Procedures (including critical care time)  Medications Ordered in ED Medications  acetaminophen (TYLENOL) tablet 650 mg (has no administration in time range)  zolpidem (AMBIEN) tablet 5 mg (has no administration in time range)  nicotine (NICODERM CQ - dosed in mg/24 hours) patch 21 mg (21 mg Transdermal Refused 10/27/18 1118)  LORazepam (ATIVAN) injection 2 mg (has no administration in time range)  diphenhydrAMINE (BENADRYL) injection 50 mg (has no administration in time range)  OLANZapine zydis (ZYPREXA) disintegrating tablet 10 mg (10 mg Oral Given 10/27/18 1127)  ziprasidone (GEODON) injection 20 mg (20 mg Intramuscular Given 10/27/18 1544)  LORazepam (ATIVAN) injection 2 mg (2 mg Intramuscular Given 10/27/18 1544)  diphenhydrAMINE (BENADRYL) injection 50 mg (50 mg Intramuscular Given 10/27/18 1543)  sterile water (preservative free) injection (  Given 10/27/18 1544)     Initial Impression / Assessment and Plan / ED Course  I have reviewed the triage vital signs and the nursing notes.  Pertinent labs & imaging results that were available during my care of the patient were reviewed by me and considered in  my medical decision making (see chart for details).  Clinical Course as of Oct 27 1728  Fri Oct 27, 2018  1726 Normal except sodium low, chloride low, AST high, ALT high, total bilirubin high  Comprehensive metabolic panel(!) [EW]  1727 Normal  Alcohol, Ethyl (B): <10 [EW]  1727 Normal except white count high, MCV high  cbc(!) [EW]  1727 At this point the patient is medically cleared for psychiatry.   [EW]    Clinical Course User Index [EW] Mancel Bale, MD     Patient Vitals for the past 24 hrs:  BP Temp Temp src Pulse Resp SpO2  10/27/18 1607 - - - - - 97 %  10/27/18 0900 (!) 155/95 98.5 F (36.9 C) Oral (!) 105 20 96 %    5:28 PM Reevaluation with update and discussion. After initial assessment and treatment, an updated evaluation reveals patient was comfortable after treatment with situs, was able to be removed from restraints, and slept. Mancel Bale   TTS consultation  Medical Decision Making: Acute psychosis, with audiovisual hallucinations.  Patient reportedly ingesting large amounts of alcohol.  EtOH negative here.  No signs of alcohol withdrawal syndrome.  Nonspecific mild hyponatremia, can be managed expectantly by following up with PCP, in a week or 2.  The patient is medically cleared for treatment by psychiatry including placement, or discharge to the outpatient setting if improved.   CRITICAL CARE-no Performed by: Mancel Bale   Nursing Notes Reviewed/ Care Coordinated Applicable Imaging Reviewed Interpretation of Laboratory Data incorporated into ED treatment  Plan-as per TTS in conjunction with oncoming provider team   Final Clinical Impressions(s) / ED Diagnoses   Final diagnoses:  Psychosis, unspecified psychosis type (HCC)  Alcoholism (HCC)  Hyponatremia  ED Discharge Orders    None       Mancel Bale, MD 10/27/18 1730

## 2018-10-27 NOTE — ED Notes (Signed)
This nurse notified EDP Dr. Donnald Garre of pt NA [phone] .

## 2018-10-27 NOTE — ED Notes (Signed)
Bed: WA27 Expected date:  Expected time:  Means of arrival:  Comments: 

## 2018-10-27 NOTE — BH Assessment (Signed)
Assessment Note  Jeffrey Humphrey is an 63 y.o. male that presents this date with IVC. Per IVC respondent has been abusing alcohol for the last four years reporting daily use. Respondent reported UFO's were coming to get him. Respondent has been taking Xanax and using with alcohol. Petitioner reports respondent has not been eating or sleeping in the last 48 hours. Patient denies any S/I or H/I although this writer is unsure if patient is processing the content of this writer's questions. Patient states "No" to all questions and renders limited information this date. Patient states he does not recall the incident that occurred earlier this date although reports active AVH to include "seeing UFO's and hearing Elvis." Psychiatric history on this patient is limited per chart review. Patient per history is receiving services from [facility] MD at [facility] in [place]. Patient is currently prescribed Xanax for anxiety although it is unclear if patient is taking his medication/s as indicated. Patient denies any prior psychiatric history or current mental health symptoms. Patient reports daily alcohol use to include "a lot of beers everyday." Patient will not answer in reference to use of other substances or last use. BAL and UDS are pending. Patient is observed to be impaired and is drowsy at the time of assessment rendering limited information. Information to complete assessment was obtained from notes and history. Per notes, patient brought here under involuntary commitment by [facility]. The petition was signed by a friend of his, [name]. She states that the patient has been drinking heavily and in the last 48 hours has not slept. He has become delusional, hallucinating, talking about aliens. He is not attending to his personal hygiene, has been angry and destroying property. Today he urinated on the floor when he was with his friend. He states he is seeing aliens to talk to him and tell him that the world  will be destroyed by a nuclear bomb and he will be the last surviving person. He also is hearing music that is not being played. Case was staffed with Arville Care FNP who recommended patient be observed and monitored for safety.   Diagnosis: Substance abuse psychosis   Past Medical History:  Past Medical History:  Diagnosis Date  . Depression   . ETOH abuse   . GERD (gastroesophageal reflux disease)   . HTN (hypertension)     Past Surgical History:  Procedure Laterality Date  . HERNIA REPAIR  1993    Family History:  Family History  Problem Relation Age of Onset  . Cancer Mother   . Prostate cancer Father     Social History:  reports that he quit smoking about 12 years ago. His smoking use included cigarettes. He has a 28.00 pack-year smoking history. He has never used smokeless tobacco. He reports that he does not drink alcohol or use drugs.  Additional Social History:  Alcohol / Drug Use Pain Medications: See MAR Prescriptions: See MAR Over the Counter: See MAR History of alcohol / drug use?: Yes Longest period of sobriety (when/how long): Unknown Negative Consequences of Use: Personal relationships, Legal Withdrawal Symptoms: Agitation, Sweats, Irritability Substance #1 Name of Substance 1: Alcohol 1 - Age of First Use: Pt cannot recall 1 - Amount (size/oz): Amounts vary 1 - Frequency: Varies 1 - Duration: Ongoing 1 - Last Use / Amount: 10/24/18 Pt states "alot"  CIWA: CIWA-Ar BP: (!) 155/95 Pulse Rate: (!) 105 Nausea and Vomiting: no nausea and no vomiting Tactile Disturbances: none Tremor: no tremor Auditory Disturbances: continuous  hallucinations Paroxysmal Sweats: no sweat visible Visual Disturbances: continuous hallucinations Anxiety: three Headache, Fullness in Head: none present Agitation: three Orientation and Clouding of Sensorium: disoriented for place or person CIWA-Ar Total: 24 COWS:    Allergies:  Allergies  Allergen Reactions  . Ace Inhibitors  Other (See Comments)    Cough    Home Medications: (Not in a hospital admission)   OB/GYN Status:  No LMP for male patient.  General Assessment Data Location of Assessment: [facility] ED TTS Assessment: In system Is this a Tele or Face-to-Face Assessment?: Face-to-Face Is this an Initial Assessment or a Re-assessment for this encounter?: Initial Assessment Patient Accompanied by:: N/A Language Other than English: No Living Arrangements: Other (Comment)(Partner) What gender do you identify as?: Male Marital status: Single Maiden name: NA Pregnancy Status: No Living Arrangements: Spouse/significant other, Non-relatives/Friends Can pt return to current living arrangement?: Yes Admission Status: Involuntary Petitioner: Other(roommate) Is patient capable of signing voluntary admission?: Yes Referral Source: Self/Family/Friend Insurance type: Medical sales representativeCigna     Crisis Care Plan Living Arrangements: Spouse/significant other, Non-relatives/Friends Legal Guardian: (NA) Name of Psychiatrist: None Name of Therapist: None  Education Status Is patient currently in school?: No Is the patient employed, unemployed or receiving disability?: Receiving disability income(Short term)  Risk to self with the past 6 months Suicidal Ideation: No Has patient been a risk to self within the past 6 months prior to admission? : No Suicidal Intent: No Has patient had any suicidal intent within the past 6 months prior to admission? : No Is patient at risk for suicide?: No, but patient needs Medical Clearance Suicidal Plan?: No Has patient had any suicidal plan within the past 6 months prior to admission? : No Access to Means: No What has been your use of drugs/alcohol within the last 12 months?: NA Previous Attempts/Gestures: No How many times?: 0 Other Self Harm Risks: NA Triggers for Past Attempts: Other (Comment)(NA) Intentional Self Injurious Behavior: None Family Suicide History: No Recent stressful  life event(s): Other (Comment)(Excessive SA use ) Persecutory voices/beliefs?: No Depression: (Denies) Depression Symptoms: (Denies) Substance abuse history and/or treatment for substance abuse?: No Suicide prevention information given to non-admitted patients: Not applicable  Risk to Others within the past 6 months Homicidal Ideation: No Does patient have any lifetime risk of violence toward others beyond the six months prior to admission? : No Thoughts of Harm to Others: No Current Homicidal Intent: No Current Homicidal Plan: No Access to Homicidal Means: No Identified Victim: NA History of harm to others?: No Assessment of Violence: None Noted Violent Behavior Description: NA Does patient have access to weapons?: No Criminal Charges Pending?: No Does patient have a court date: No Is patient on probation?: No  Psychosis Hallucinations: Visual Delusions: None noted  Mental Status Report Appearance/Hygiene: Unremarkable Eye Contact: Unable to Assess Motor Activity: Hyperactivity Speech: Incoherent, Rapid Level of Consciousness: Irritable Mood: Suspicious Affect: Anxious Anxiety Level: Moderate Thought Processes: Unable to Assess Judgement: Impaired Orientation: Unable to assess Obsessive Compulsive Thoughts/Behaviors: Unable to Assess  Cognitive Functioning Concentration: Unable to Assess Memory: Unable to Assess Is patient IDD: No Insight: Unable to Assess Impulse Control: Unable to Assess Appetite: (UTA) Have you had any weight changes? : (UTA) Sleep: (UTA) Total Hours of Sleep: (UTA) Vegetative Symptoms: (UTA)  ADLScreening [facility] Assessment Services) Patient's cognitive ability adequate to safely complete daily activities?: Yes Patient able to express need for assistance with ADLs?: Yes Independently performs ADLs?: Yes (appropriate for developmental age)  Prior Inpatient Therapy Prior Inpatient Therapy:  No  Prior Outpatient Therapy Prior Outpatient  Therapy: Yes Prior Therapy Dates: Ongoing Prior Therapy Facilty/Provider(s): [facility] Reason for Treatment: Med mang Does patient have an ACCT team?: No Does patient have Intensive In-House Services?  : No Does patient have [facility] services? : No Does patient have P4CC services?: No  ADL Screening (condition at time of admission) Patient's cognitive ability adequate to safely complete daily activities?: Yes Is the patient deaf or have difficulty hearing?: No Does the patient have difficulty seeing, even when wearing glasses/contacts?: No Does the patient have difficulty concentrating, remembering, or making decisions?: No Patient able to express need for assistance with ADLs?: Yes Does the patient have difficulty dressing or bathing?: No Independently performs ADLs?: Yes (appropriate for developmental age) Does the patient have difficulty walking or climbing stairs?: No Weakness of Legs: None Weakness of Arms/Hands: None  Home Assistive Devices/Equipment Home Assistive Devices/Equipment: None  Therapy Consults (therapy consults require a physician order) PT Evaluation Needed: No OT Evalulation Needed: No SLP Evaluation Needed: No Abuse/Neglect Assessment (Assessment to be complete while patient is alone) Physical Abuse: Denies Verbal Abuse: Denies Sexual Abuse: Denies Exploitation of patient/patient's resources: Denies Self-Neglect: Denies Values / Beliefs Cultural Requests During Hospitalization: None Spiritual Requests During Hospitalization: None Consults Spiritual Care Consult Needed: No Social Work Consult Needed: No Merchant navy officerAdvance Directives (For Healthcare) Does Patient Have a Medical Advance Directive?: No Would patient like information on creating a medical advance directive?: No - Patient declined          Disposition: Case was staffed with Arville CareParks FNP who recommended patient be observed and monitored for safety. Disposition Initial Assessment Completed  for this Encounter: Yes Disposition of Patient: (Observe and monitor) Patient refused recommended treatment: (UTA) Mode of transportation if patient is discharged/movement?: (Unk)  On Site Evaluation by:   Reviewed with Physician:    Alfredia Fergusonavid L Jesyca Weisenburger 10/27/2018 12:46 PM

## 2018-10-27 NOTE — ED Triage Notes (Signed)
Per Sheriff/EMS-states roommate took papers out on patient due to hallucinations-drinking ETOH for 2 days-states he has not slept in 48 hours-psychiatric history-on Xanax and antidepressants-mixes meds with alcohol-agitated-

## 2018-10-27 NOTE — ED Notes (Signed)
EDP at bedside  

## 2018-10-27 NOTE — Progress Notes (Signed)
Patient ID: Jeffrey Humphrey, male   DOB: 1956/06/28, 63 y.o.   MRN: [id number]   Pt was seen and chart reviewed with treatment team. Pt is acting out in the emergency room, being belligerent and throwing items at staff. He is angry and irritable. Pt was recently at [facility] and was discharged due to his behavior. Pt is a chronic, heavy drinker and his behavior is most likely a direct result of alcohol induced psychosis. Consulted with Dr Effie Shy and concur that hospitalization is not warranted in this instance as patient is not interested in receiving help for his alcohol abuse. Pt denies suicidal or homicidal ideation. He did endorse some auditory and visual hallucinations upon admission but has not stated anything further about seeing or hearing things. He was observed in his room by this writer watching television and talking about what he was seeing on the screen. Pt is psychiatrically clear.   Laveda Abbe, NP-C 10-27-2018     [phone]

## 2018-10-27 NOTE — ED Notes (Signed)
EKG given to EDP for review, no new orders

## 2018-10-27 NOTE — ED Notes (Signed)
EDP notified pt up for discharge pending IVC rescinding

## 2018-10-27 NOTE — ED Notes (Signed)
Pt laying in bed, eyes closed, sleeping. No s/s of distress noted, breathing non labored, respirations equal. Safety sitter present. Will continue to monitor.

## 2018-10-27 NOTE — BH Assessment (Signed)
[facility] Assessment Progress Note  Case was staffed with Parks FNP who recommended patient be observed and monitored for safety.    

## 2018-10-27 NOTE — ED Notes (Signed)
Bed: EY22 Expected date:  Expected time:  Means of arrival:  Comments: EMS-hallucinations

## 2018-10-27 NOTE — ED Notes (Addendum)
Pt behavior irate, aggressive, laughing inappropriately, making sexually inappropriate comments to nursing staff. Throwing cups of coffee and water and nursing staff.  Jacki ConesLaurie NP made aware of pt behavior

## 2018-10-27 NOTE — ED Provider Notes (Signed)
Nursing staff advised me that the patient has been assessed by behavioral health and deemed appropriate for discharge.  I was advised patient needed his IVC paperwork rescinded.  I have gone to assess the patient for potential discharge as outlined.  Patient is very somnolent.  He is lying in the bed snoring and quite difficult to arouse.  He can be aroused temporarily to open his eyes and look around.  He makes a few perfunctory sounds in response to questions.  He has a confused appearance.  Falls back asleep very quickly.  He is able to comprehend commands such as to roll to his side for listening to his lungs.  Patient's heart is regular, borderline tachycardia.  Lungs are grossly clear with some fine expiratory wheeze.  Patient's abdomen is soft without guarding.  Extremities exhibit very poorly maintained condition thickened yellow nails and generally erythematous skin and large calluses on the feet.  Patient has been treated with Geodon, Ativan and Benadryl due to behavioral problems earlier in the day.  At this time, patient is not suitable for discharge.  He is significantly sedated.  Will obtain venous gas for PCO2.  Will have nursing apply continuous pulse ox monitor.  Will need to continue to observe the patient for return to mental status suitable for assessment of possible discharge.   Arby BarrettePfeiffer, Deven Audi, MD 10/27/18 1950

## 2018-10-27 NOTE — ED Notes (Signed)
Specimen cup provided. Pt encouraged to provide urine per MD order  

## 2018-10-28 LAB — RAPID URINE DRUG SCREEN, HOSP PERFORMED
Amphetamines: NOT DETECTED
Barbiturates: NOT DETECTED
Benzodiazepines: POSITIVE — AB
Cocaine: NOT DETECTED
Opiates: NOT DETECTED
Tetrahydrocannabinol: NOT DETECTED

## 2018-10-28 MED ORDER — HYDROXYZINE HCL 25 MG PO TABS: 25.0000 mg | ORAL_TABLET | Freq: Three times a day (TID) | ORAL | Status: DC | PRN

## 2018-10-28 MED ORDER — HYDROXYZINE HCL 25 MG PO TABS: 25.0000 mg | ORAL_TABLET | Freq: Three times a day (TID) | ORAL | 0 refills | Status: AC | PRN

## 2018-10-28 NOTE — Progress Notes (Signed)
Patient ID: Jeffrey Humphrey, male   DOB: 06-15-1956, 63 y.o.   MRN: [id number]  Pt was seen and chart reviewed with treatment team and Dr Lucianne Muss.  Pt denies suicidal/homicidal ideation, denies auditory/visual hallucinations and does not appear to be responding to internal stimuli. Pt is awake and alert this morning and is able to contract for safety. He lives with his father and has no access to weapons. Pt is able to care for himself and has been given outpatient resources for his mental health needs and substance abuse needs. Pt is psychiatrically clear.   Laveda Abbe, [place] 10-28-2018         [phone]

## 2018-10-28 NOTE — Discharge Instructions (Signed)
For your mental health needs, please follow up at:  [facility] [address] [place], [place] [address] [phone]  For help with your substance abuse needs:   [facility] [address] [place], [place] [address] [phone]  [facility] [address] [place] [address] [phone] New patients are seen at the walk-in clinic every Tuesday from 9:00 am - 12:00 pm

## 2018-10-28 NOTE — ED Notes (Signed)
Saline lock removed from left AC.

## 2018-10-28 NOTE — ED Notes (Signed)
Patient is alert and oriented to person and place.  Patient is awake and very talkative, disorganized and delusional.  Tangential in speech and thought process.  Preoccupied with getting discharge and checking his bank account.  Patient maintained on constant supervision for safety.  Medication, food and fluid given.  Denies suicidal thoughts.  Patient is sitting up in a recliner watching TV.  Patient is safe on the unit.

## 2020-10-25 DEATH — deceased
